# Patient Record
Sex: Female | Born: 1980 | Race: White | Hispanic: No | Marital: Married | Smoking: Never smoker
Health system: Southern US, Community
[De-identification: ages and names within clinical notes are randomized; demographics above are authoritative.]

## PROBLEM LIST (undated history)

## (undated) DIAGNOSIS — Z800 Family history of malignant neoplasm of digestive organs: Secondary | ICD-10-CM

## (undated) DIAGNOSIS — E559 Vitamin D deficiency, unspecified: Secondary | ICD-10-CM

## (undated) DIAGNOSIS — R011 Cardiac murmur, unspecified: Secondary | ICD-10-CM

## (undated) DIAGNOSIS — R519 Headache, unspecified: Secondary | ICD-10-CM

## (undated) DIAGNOSIS — K5790 Diverticulosis of intestine, part unspecified, without perforation or abscess without bleeding: Secondary | ICD-10-CM

## (undated) DIAGNOSIS — C4359 Malignant melanoma of other part of trunk: Secondary | ICD-10-CM

## (undated) DIAGNOSIS — R51 Headache: Secondary | ICD-10-CM

## (undated) DIAGNOSIS — E538 Deficiency of other specified B group vitamins: Secondary | ICD-10-CM

## (undated) DIAGNOSIS — K635 Polyp of colon: Secondary | ICD-10-CM

## (undated) DIAGNOSIS — K5710 Diverticulosis of small intestine without perforation or abscess without bleeding: Secondary | ICD-10-CM

## (undated) DIAGNOSIS — J45909 Unspecified asthma, uncomplicated: Secondary | ICD-10-CM

## (undated) DIAGNOSIS — F419 Anxiety disorder, unspecified: Secondary | ICD-10-CM

## (undated) DIAGNOSIS — K633 Ulcer of intestine: Secondary | ICD-10-CM

## (undated) DIAGNOSIS — D3A026 Benign carcinoid tumor of the rectum: Secondary | ICD-10-CM

## (undated) DIAGNOSIS — K219 Gastro-esophageal reflux disease without esophagitis: Secondary | ICD-10-CM

## (undated) DIAGNOSIS — K5090 Crohn's disease, unspecified, without complications: Secondary | ICD-10-CM

## (undated) DIAGNOSIS — T7840XA Allergy, unspecified, initial encounter: Secondary | ICD-10-CM

## (undated) DIAGNOSIS — K602 Anal fissure, unspecified: Secondary | ICD-10-CM

## (undated) DIAGNOSIS — B009 Herpesviral infection, unspecified: Secondary | ICD-10-CM

## (undated) HISTORY — DX: Vitamin D deficiency, unspecified: E55.9

## (undated) HISTORY — DX: Benign carcinoid tumor of the rectum: D3A.026

## (undated) HISTORY — DX: Allergy, unspecified, initial encounter: T78.40XA

## (undated) HISTORY — DX: Malignant melanoma of other part of trunk: C43.59

## (undated) HISTORY — PX: WISDOM TOOTH EXTRACTION: SHX21

## (undated) HISTORY — PX: MELANOMA EXCISION: SHX5266

## (undated) HISTORY — DX: Ulcer of intestine: K63.3

## (undated) HISTORY — DX: Deficiency of other specified B group vitamins: E53.8

## (undated) HISTORY — DX: Diverticulosis of intestine, part unspecified, without perforation or abscess without bleeding: K57.90

## (undated) HISTORY — DX: Polyp of colon: K63.5

## (undated) HISTORY — DX: Anxiety disorder, unspecified: F41.9

## (undated) HISTORY — DX: Diverticulosis of small intestine without perforation or abscess without bleeding: K57.10

## (undated) HISTORY — DX: Herpesviral infection, unspecified: B00.9

## (undated) HISTORY — DX: Cardiac murmur, unspecified: R01.1

## (undated) HISTORY — DX: Family history of malignant neoplasm of digestive organs: Z80.0

## (undated) HISTORY — PX: COLONOSCOPY: SHX174

---

## 2000-03-01 ENCOUNTER — Other Ambulatory Visit: Admission: RE | Admit: 2000-03-01 | Discharge: 2000-03-01 | Payer: Self-pay

## 2001-03-03 ENCOUNTER — Ambulatory Visit (HOSPITAL_COMMUNITY): Admission: RE | Admit: 2001-03-03 | Discharge: 2001-03-03 | Payer: Self-pay | Admitting: Surgery

## 2001-03-03 ENCOUNTER — Encounter: Admission: RE | Admit: 2001-03-03 | Discharge: 2001-03-03 | Payer: Self-pay | Admitting: Surgery

## 2001-03-03 ENCOUNTER — Encounter: Payer: Self-pay | Admitting: Surgery

## 2001-03-07 ENCOUNTER — Ambulatory Visit (HOSPITAL_BASED_OUTPATIENT_CLINIC_OR_DEPARTMENT_OTHER): Admission: RE | Admit: 2001-03-07 | Discharge: 2001-03-07 | Payer: Self-pay | Admitting: Surgery

## 2001-03-07 ENCOUNTER — Encounter (INDEPENDENT_AMBULATORY_CARE_PROVIDER_SITE_OTHER): Payer: Self-pay | Admitting: Specialist

## 2001-03-13 ENCOUNTER — Other Ambulatory Visit: Admission: RE | Admit: 2001-03-13 | Discharge: 2001-03-13 | Payer: Self-pay

## 2002-12-25 ENCOUNTER — Other Ambulatory Visit: Admission: RE | Admit: 2002-12-25 | Discharge: 2002-12-25 | Payer: Self-pay | Admitting: Obstetrics and Gynecology

## 2003-06-19 ENCOUNTER — Other Ambulatory Visit: Admission: RE | Admit: 2003-06-19 | Discharge: 2003-06-19 | Payer: Self-pay | Admitting: Obstetrics and Gynecology

## 2003-10-31 ENCOUNTER — Other Ambulatory Visit: Admission: RE | Admit: 2003-10-31 | Discharge: 2003-10-31 | Payer: Self-pay | Admitting: Obstetrics and Gynecology

## 2004-04-30 ENCOUNTER — Other Ambulatory Visit: Admission: RE | Admit: 2004-04-30 | Discharge: 2004-04-30 | Payer: Self-pay | Admitting: Obstetrics and Gynecology

## 2004-11-18 ENCOUNTER — Other Ambulatory Visit: Admission: RE | Admit: 2004-11-18 | Discharge: 2004-11-18 | Payer: Self-pay | Admitting: Obstetrics and Gynecology

## 2007-02-22 ENCOUNTER — Encounter: Admission: RE | Admit: 2007-02-22 | Discharge: 2007-02-22 | Payer: Self-pay | Admitting: Gastroenterology

## 2008-02-07 ENCOUNTER — Encounter: Admission: RE | Admit: 2008-02-07 | Discharge: 2008-02-07 | Payer: Self-pay | Admitting: Obstetrics and Gynecology

## 2008-06-26 ENCOUNTER — Inpatient Hospital Stay (HOSPITAL_COMMUNITY): Admission: AD | Admit: 2008-06-26 | Discharge: 2008-06-26 | Payer: Self-pay | Admitting: Obstetrics and Gynecology

## 2008-10-29 ENCOUNTER — Inpatient Hospital Stay (HOSPITAL_COMMUNITY): Admission: AD | Admit: 2008-10-29 | Discharge: 2008-11-01 | Payer: Self-pay | Admitting: Obstetrics and Gynecology

## 2008-11-02 ENCOUNTER — Encounter: Admission: RE | Admit: 2008-11-02 | Discharge: 2008-12-02 | Payer: Self-pay | Admitting: Obstetrics and Gynecology

## 2008-12-03 ENCOUNTER — Encounter: Admission: RE | Admit: 2008-12-03 | Discharge: 2009-01-02 | Payer: Self-pay | Admitting: Obstetrics and Gynecology

## 2009-01-03 ENCOUNTER — Encounter: Admission: RE | Admit: 2009-01-03 | Discharge: 2009-02-01 | Payer: Self-pay | Admitting: Obstetrics and Gynecology

## 2009-02-02 ENCOUNTER — Encounter: Admission: RE | Admit: 2009-02-02 | Discharge: 2009-03-04 | Payer: Self-pay | Admitting: Obstetrics and Gynecology

## 2009-03-05 ENCOUNTER — Encounter: Admission: RE | Admit: 2009-03-05 | Discharge: 2009-04-03 | Payer: Self-pay | Admitting: Obstetrics and Gynecology

## 2009-04-04 ENCOUNTER — Encounter: Admission: RE | Admit: 2009-04-04 | Discharge: 2009-04-24 | Payer: Self-pay | Admitting: Obstetrics and Gynecology

## 2009-05-05 ENCOUNTER — Encounter: Admission: RE | Admit: 2009-05-05 | Discharge: 2009-05-14 | Payer: Self-pay | Admitting: Obstetrics and Gynecology

## 2010-05-17 ENCOUNTER — Encounter: Payer: Self-pay | Admitting: Gastroenterology

## 2010-06-03 ENCOUNTER — Other Ambulatory Visit: Payer: Self-pay | Admitting: Dermatology

## 2010-08-02 LAB — CBC
HCT: 32.9 % — ABNORMAL LOW (ref 36.0–46.0)
Hemoglobin: 11.4 g/dL — ABNORMAL LOW (ref 12.0–15.0)
MCHC: 34.7 g/dL (ref 30.0–36.0)
MCHC: 34.7 g/dL (ref 30.0–36.0)
MCV: 81.1 fL (ref 78.0–100.0)
Platelets: 234 10*3/uL (ref 150–400)
RBC: 3.38 MIL/uL — ABNORMAL LOW (ref 3.87–5.11)
RDW: 14.2 % (ref 11.5–15.5)

## 2010-08-06 LAB — CBC
MCV: 84.6 fL (ref 78.0–100.0)
Platelets: 305 10*3/uL (ref 150–400)
WBC: 9.4 10*3/uL (ref 4.0–10.5)

## 2010-09-11 NOTE — Op Note (Signed)
[place]  Patient:    Kelli Lewis, Kelli Lewis Visit Number: [id number] MRN: [id number]          Service Type: DSU Location: [facility] Attending Physician:  Katha Cabal Dictated by:   Thornton Park Daphine Deutscher, M.D. Proc. Date: 03/07/01 Admit Date:  03/07/2001   CC:         [facility]  Ria Bush. Jorja Loa, M.D.   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  It was at least 1.03 mm and the margin was focally involved.  The thickness was therefore a minimum and not complete estimate of Breslow level.  The patient was seen with her parents in the office and informed consent was obtained regarding sentinel lymph node biopsy and wide excisional biopsy and primary closure.  Because of the depth of the lesion, it was felt that at least greater than 1 cm and almost 2 cm margin was necessary for complete excision and this was marked.  SURGEON:  Thornton Park. Daphine Deutscher, M.D.  ANESTHESIA:  General by LMA.  DESCRIPTION OF PROCEDURE:  The patient was taken to room 4 at [facility] and the area had been previously injected.  I mapped the inguinal region and found a hot area with counts of 1500 and marked that; this was just above the inguinal crease in the left lower quadrant.  I went ahead and injected the melanoma site with 1 cc of Lymphazurin blue and massaged that area a bit and then placed her back supine.  The groin area was prepped with Betadine and draped sterilely.  I made about a 2 cm incision over the hot area and went down through the superficial fascia to a blue, hot node.  This was carefully mobilized and the lymphatic channels going into it were ligated with 4-0 Vicryl and then the pedicles surrounding it were all ligated with 4-0 Vicryl suture ligatures.  It was removed, at which time,  its counts were in excess of 2500.  Background counts in the biopsy site were 25 and 25 was generally what was seen in the rest of the leg.  Therefore, this was the solitary blue, hot node.  The wound was closed with 4-0 Vicryl and with running 4-0 Prolene.  The patient was then lifted up with her leg, buttocks up, and the entire buttocks and the area that I had previously marked was prepped.  I tried to create an incision along the skin lines to the degree possible.  This was down on the lateral thigh on the left side.  I ended up excising this ellipse approximately 9 cm long and it leaves a little greater than 3 cm almost 4 cm wide that had about a 1.5 on approaching 2 cm margin.  This was carried down, straight down to the through the fat and was taken with sharp dissection off the fascia.  Bleeding was controlled with the electrocautery.  A short suture was placed on the anterior margin and a long suture was placed on the inferior margin of this ellipse.  When it was removed, it was sent for margins.  The wound was irrigated with saline.  I then approximated the skin using 4-0 Vicryl subcutaneously and subcuticularly.  A running 5-0 subcuticular suture was placed and then the final skin closure was performed with a running 4-0 Prolene with a  vertical mattress suture of 4-0 Prolene in the middle.  Sterile dressings were applied.  The patient was awakened.  She will be given Percocet 7.5/325 to take for pain and will be followed up in the office for suture removal in approximately ten days. Dictated by:   Thornton Park Daphine Deutscher, M.D. Attending Physician:  Katha Cabal DD:  03/07/01 TD:  03/07/01 Job: [id number]

## 2011-01-04 ENCOUNTER — Encounter: Payer: Self-pay | Admitting: Internal Medicine

## 2011-01-04 ENCOUNTER — Ambulatory Visit (INDEPENDENT_AMBULATORY_CARE_PROVIDER_SITE_OTHER): Payer: Managed Care, Other (non HMO) | Admitting: Internal Medicine

## 2011-01-04 VITALS — BP 112/80 | HR 78 | Ht 65.0 in | Wt 131.2 lb

## 2011-01-04 DIAGNOSIS — R109 Unspecified abdominal pain: Secondary | ICD-10-CM

## 2011-01-04 DIAGNOSIS — R1030 Lower abdominal pain, unspecified: Secondary | ICD-10-CM

## 2011-01-04 DIAGNOSIS — K602 Anal fissure, unspecified: Secondary | ICD-10-CM | POA: Insufficient documentation

## 2011-01-04 DIAGNOSIS — K625 Hemorrhage of anus and rectum: Secondary | ICD-10-CM

## 2011-01-04 HISTORY — DX: Anal fissure, unspecified: K60.2

## 2011-01-04 MED ORDER — HYOSCYAMINE SULFATE 0.125 MG PO TABS: 0.1250 mg | ORAL_TABLET | ORAL | Status: DC | PRN

## 2011-01-04 MED ORDER — HYOSCYAMINE SULFATE 0.125 MG SL SUBL: SUBLINGUAL_TABLET | SUBLINGUAL | Status: DC

## 2011-01-04 NOTE — Progress Notes (Signed)
Subjective:    Patient ID: Kelli Lewis, female    DOB: 04-01-1981, 30 y.o.   MRN: [id number]  HPI Kelli Lewis is a 30 year old female with a history of anal fissure who presents today, alone, for evaluation of rectal bleeding and lower abdominal pain.  The patient states she has an on-and-off history of bright red blood per, and that this has previously been evaluated both by Dr. Victorino Dike and Dr. Kinnie Scales.  She reports a normal flexible sigmoidoscopy in 2003, and subsequent upper endoscopy and colonoscopy in around 2007 (I do not have these reports today).  Over the last 1 month she has seen rectal bleeding daily. She reports having one to 2 bowel movements per day and with these bright red blood. She notes red blood in her stool and on the toilet tissue. Occasionally it will drip into the toilet water.  Occasionally she does have pain with passing stools but she denies constipation or hard stool. She also reports episodic loose stools associated with lower abdominal cramping. She reports this on occasion occurs at night, as the cramping wakes her from sleep and this is followed by 2-3 episodes of loose stool.  She reports a significant worsening of loose stool and abdominal cramping with stress.  She has not been able to associate this with eating or specific foods. She denies nausea and vomiting. She's had no fever or chills.  She does endorse abdominal bloating.  She denies heartburn, epigastric pain, dysphagia, and odynophagia.     Review of Systems Constitutional: Negative for fever, chills, night sweats, activity change, appetite change and unexpected weight change HEENT: Negative for sore throat, mouth sores and trouble swallowing. Eyes: Negative for visual disturbance Respiratory: Negative for cough, chest tightness and shortness of breath Cardiovascular: Negative for chest pain, palpitations and lower extremity swelling Gastrointestinal: See history of present illness Genitourinary: Negative  for dysuria and hematuria. Musculoskeletal: Negative for back pain, arthralgias and myalgias Skin: Negative for rash or color change Neurological: Negative for headaches, weakness, numbness Hematological: Negative for adenopathy, negative for easy bruising/bleeding Psychiatric/behavioral: Negative for depressed mood, negative for anxiety, increased stress recently over the separation from her husband  PMH: Anal fissures  PSH: neg  Meds: Mirena singulair  All: NKDA  Family History  Problem Relation Age of Onset  . Colon cancer Paternal Grandmother   . Breast cancer Paternal Grandmother   . Parkinsonism Father   . Irritable bowel syndrome Mother   . 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  Recent separation from her husband and this has been associated with significant stress.  Denies tob, ETOH, illicits.     Objective:   Physical Exam BP 112/80  Pulse 78  Ht 5\' 5"  (1.651 m)  Wt 131 lb 3.2 oz (59.512 kg)  BMI 21.83 kg/m2 Constitutional: Well-developed and well-nourished. No distress. HEENT: Normocephalic and atraumatic. Oropharynx is clear and moist. No oropharyngeal exudate. Conjunctivae are normal. Pupils are equal round and reactive to light. No scleral icterus. Neck: Neck supple. Trachea midline. Cardiovascular: Normal rate, regular rhythm and intact distal pulses. No M/R/G Pulmonary/chest: Effort normal and breath sounds normal. No wheezing, rales or rhonchi. Abdominal: Soft, nontender, nondistended. Bowel sounds active throughout. There are no masses palpable. No hepatosplenomegaly. Rectal: normal external exam with hemorrhoids or fissures, internal exam without pain or masses Lymphadenopathy: No cervical adenopathy noted. Neurological: Alert and oriented to person place and time. Skin: Skin is warm and dry. No rashes  noted. Psychiatric: Normal mood and affect. Behavior is normal.     Assessment & Plan:  This is a 30 year old female with a past  medical history of anal fissure who presents with recent increased rectal bleeding and intermittent/episodic lower abdominal pain with cramping and loose stools.  1. Rectal bleeding - her rectal examination was unrevealing today, however the bleeding is most consistent with hemorrhoidal bleeding. I cannot however rule out rectal inflammation and dust will schedule her for an unsedated flexible sigmoidoscopy. This will be performed to rule out proctitis.  I also have requested the old records including procedure records from Dr. Kinnie Scales.  2. Abd pain/cramping - the patient's lower abdominal cramping and episodic loose stools associated with stress is most consistent with IBS.  We discussed this today. It does seem that her symptoms are somewhat better now that her stress levels have decreased after her recent separation.  There are no alarm symptoms, other than the bleeding for which we will investigate with flexible sigmoidoscopy.  I have given her samples of Align, 1 tablet daily. We will use this an attempt to regulate her bowel pattern. I've also given her prescription for Levsin 0.125 mg one to 2 tablets every 4 hours when necessary cramping/spasm.  We can evaluate her response to these medications at her flex will sigmoidoscopy.  Followup will be determined after flexible sigmoidoscopy.

## 2011-01-04 NOTE — Patient Instructions (Signed)
Please start on Align 1 capsule daily You have been scheduled for a flexible sigmoidoscopy, see additional instructions

## 2011-01-14 ENCOUNTER — Telehealth: Payer: Self-pay | Admitting: Internal Medicine

## 2011-01-14 NOTE — Telephone Encounter (Signed)
Given abdominal pain and bloating, full colonoscopy is reasonable and can be scheduled. She will need to bring a driver as this exam will be sedated. She also needs to get a full colonoscopy preparation I recommend MiraLAX 17 g twice daily for one day then 17 g daily thereafter for constipationI I am not sure based on scheduled if the exam can be moved up I would still like to obtain the records from Dr. Kinnie Scales of her prior procedures

## 2011-01-14 NOTE — Telephone Encounter (Signed)
Pt's OV 01/04/11 for rectal bleeding and intermittent lower abdominal pain and cramping with loose stools. Dr Rhea Belton will do a flex sig to r/o bleeding hemorrhoids, possible IBS. She was given Librarian, academic and Levsin. Today, pt reports she's constipated, no BM in 4 days. Her cramping still comes and goes and she feels bloated. She has rectal pain/pressure while sitting also. Pt has FLEX SIG scheduled for 01/26/11 at 2:30; pt wonders if she should have a COLON instead and can it be done earlier? Suggestions for constipation? Thanks.

## 2011-01-14 NOTE — Telephone Encounter (Signed)
lmom for pt to call. There is an opening on 01/20/11 at 0900.

## 2011-01-14 NOTE — Telephone Encounter (Signed)
Informed pt Dr Rhea Belton will do a COLON on her. She is aware this is a 2 day prep procedure and will come for her PV on 01/18/11 and her COLON is 01/26/11 at 2:30 pm.

## 2011-01-15 ENCOUNTER — Other Ambulatory Visit: Payer: Self-pay | Admitting: Internal Medicine

## 2011-01-15 ENCOUNTER — Encounter: Payer: Self-pay | Admitting: Internal Medicine

## 2011-01-15 DIAGNOSIS — K625 Hemorrhage of anus and rectum: Secondary | ICD-10-CM

## 2011-01-15 NOTE — Progress Notes (Signed)
I scheduled pt for the wrong PV day, so I did the Pre Visit instructions and forms. Pt stated she did not tolerate Movi Prep, so she was given SUPREP; pt stated understanding with the instructions.

## 2011-01-18 ENCOUNTER — Other Ambulatory Visit: Payer: Managed Care, Other (non HMO)

## 2011-01-26 ENCOUNTER — Encounter: Payer: Self-pay | Admitting: Internal Medicine

## 2011-01-26 ENCOUNTER — Ambulatory Visit (AMBULATORY_SURGERY_CENTER): Payer: Managed Care, Other (non HMO) | Admitting: Internal Medicine

## 2011-01-26 VITALS — BP 120/78 | HR 68 | Temp 97.0°F | Resp 18 | Ht 65.0 in | Wt 131.0 lb

## 2011-01-26 DIAGNOSIS — K5289 Other specified noninfective gastroenteritis and colitis: Secondary | ICD-10-CM

## 2011-01-26 DIAGNOSIS — K625 Hemorrhage of anus and rectum: Secondary | ICD-10-CM

## 2011-01-26 DIAGNOSIS — D126 Benign neoplasm of colon, unspecified: Secondary | ICD-10-CM

## 2011-01-26 DIAGNOSIS — K5120 Ulcerative (chronic) proctitis without complications: Secondary | ICD-10-CM

## 2011-01-26 MED ORDER — SODIUM CHLORIDE 0.9 % IV SOLN: 500.0000 mL | INTRAVENOUS | Status: DC

## 2011-01-26 MED ORDER — MESALAMINE 1000 MG RE SUPP: 1000.0000 mg | Freq: Every day | RECTAL | Status: DC

## 2011-01-26 NOTE — Patient Instructions (Signed)
Please read the handouts given to you in the envelope by your recovery room nurse.   Your biopsy results will be mailed to you within 2 weeks.  Dr. Rhea Belton would like for you to start canasa suppositories 1000mg  at bedtime to reduce inflammation.  Try to leave it in for at least three hours.  Please, make an appointment to see me in the office in 4 weeks.   I would call tomorrow for this appointment,   If you have any questions, please call [phone].   Thank-you for choosing [place] today for your healthcare needs.

## 2011-01-27 ENCOUNTER — Telehealth: Payer: Self-pay | Admitting: *Deleted

## 2011-01-27 NOTE — Telephone Encounter (Signed)
Follow up Call- Patient questions:  Do you have a fever, pain , or abdominal swelling? no Pain Score  0 *  Have you tolerated food without any problems? yes  Have you been able to return to your normal activities? yes  Do you have any questions about your discharge instructions: Diet   no Medications  no Follow up visit  no  Do you have questions or concerns about your Care? yes Pt had multiple questions about report. Educated pt on findings and recommendations. Pt verbalized understanding.  Actions: * If pain score is 4 or above: No action needed, pain <4.

## 2011-01-27 NOTE — Telephone Encounter (Signed)
Notified pt she has a f/u with Dr Rhea Belton on 02/23/11 at 0900am; pt stated understanding.

## 2011-02-05 ENCOUNTER — Encounter: Payer: Self-pay | Admitting: Internal Medicine

## 2011-02-08 ENCOUNTER — Telehealth: Payer: Self-pay | Admitting: *Deleted

## 2011-02-08 NOTE — Telephone Encounter (Signed)
Informed pt of Dr Lauro Franklin findings. Pt reports she is much better- no blood in her stool. She is using the suppositories, Canasa prn; she will f/u on 02/23/11 at 0900 and call for further problems.

## 2011-02-08 NOTE — Telephone Encounter (Signed)
Letter from: Beverley Fiedler   Comments: 02/08/11 SENT Lucienne Capers  Please call patient to check on her symptoms. I started medication after colonoscopy. The bx results are consistent with IBD (maybe Crohn's), which is a new dx for her.  I need to see her in clinic in about 1 month.      lmom for pt to call back.

## 2011-02-23 ENCOUNTER — Encounter: Payer: Self-pay | Admitting: Internal Medicine

## 2011-02-26 ENCOUNTER — Ambulatory Visit (INDEPENDENT_AMBULATORY_CARE_PROVIDER_SITE_OTHER): Payer: Managed Care, Other (non HMO) | Admitting: Internal Medicine

## 2011-02-26 ENCOUNTER — Encounter: Payer: Self-pay | Admitting: Internal Medicine

## 2011-02-26 VITALS — BP 102/70 | HR 82 | Ht 65.0 in | Wt 137.8 lb

## 2011-02-26 DIAGNOSIS — K529 Noninfective gastroenteritis and colitis, unspecified: Secondary | ICD-10-CM | POA: Insufficient documentation

## 2011-02-26 DIAGNOSIS — K5289 Other specified noninfective gastroenteritis and colitis: Secondary | ICD-10-CM

## 2011-02-26 MED ORDER — MESALAMINE 1.2 G PO TBEC: 2400.0000 mg | DELAYED_RELEASE_TABLET | Freq: Every day | ORAL | Status: DC

## 2011-02-26 MED ORDER — SIMETHICONE 125 MG PO TABS: 125.0000 mg | ORAL_TABLET | ORAL | Status: DC | PRN

## 2011-02-26 NOTE — Progress Notes (Signed)
Subjective:    Patient ID: Kelli Lewis, female    DOB: 1980-07-08, 30 y.o.   MRN: [id number]  HPI Kelli Lewis is a 30 year old female with a recent diagnosis of indeterminate inflammatory bowel disease who returns for followup.  The patient underwent colonoscopy in October 2012 at that time was found to have proctitis as well as periappendiceal inflammation which on pathology revealed both mild acute and chronic inflammation consistent with inflammatory bowel disease. No granulomas were seen. No dysplasia was seen. The terminal ileum appeared normal at this procedure.  After the procedure she was started on Canasa suppository. She reports that she has been using this as needed, but not daily because it gives her the feeling of constipation. She is using it maybe once a week at present. She is using Citrucel one to 2 chewable tablets daily for bloating. Abdominal bloating continues to be a major symptom for her. She also continues to have intermittent loose stools and is frequently seeing rectal bleeding. She reports having anywhere from one to 5 bowel movements a day. She will occasionally have nocturnal stools. She denies fevers or chills. She is having no nausea or vomiting. Her appetite has been good. Her weight is stable. Overall she will occasionally have lower abdominal cramping but bloating bothers her more than pain. She does report tenesmus and some rectal pain.  No joint pains, rashes, or eye symptoms.   Review of Systems Constitutional: Negative for fever, chills, night sweats, activity change, appetite change and unexpected weight change HEENT: Negative for sore throat, mouth sores and trouble swallowing. Eyes: Negative for visual disturbance Respiratory: Negative for cough, chest tightness and shortness of breath Cardiovascular: Negative for chest pain, palpitations and lower extremity swelling Gastrointestinal: See history of present illness Genitourinary: Negative for dysuria and  hematuria. Musculoskeletal: Negative for back pain, arthralgias and myalgias Skin: Negative for rash or color change Neurological: Negative for headaches, weakness, numbness Hematological: Negative for adenopathy, negative for easy bruising/bleeding Psychiatric/behavioral: Negative for depressed mood, negative for anxiety  Patient Active Problem List  Diagnoses  . Anal fissure  . IBD (inflammatory bowel disease)   Current Outpatient Prescriptions  Medication Sig Dispense Refill  . doxycycline (VIBRAMYCIN) 100 MG capsule 1 tablet Daily.      . hyoscyamine (LEVSIN SL) 0.125 MG SL tablet Place 1-2 tablets under tongue q 4 hours prn abdominal pain  60 tablet  2  . levonorgestrel (MIRENA) 20 MCG/24HR IUD 1 each by Intrauterine route once.        . mesalamine (CANASA) 1000 MG suppository Place 1 suppository (1,000 mg total) rectally at bedtime.  30 suppository  2  . montelukast (SINGULAIR) 10 MG tablet Take 10 mg by mouth at bedtime.        . mesalamine (LIALDA) 1.2 G EC tablet Take 2 tablets (2.4 g total) by mouth daily with breakfast.  60 tablet  5  . Simethicone 125 MG TABS Take 1 tablet (125 mg total) by mouth as needed.  90 tablet     No Known Allergies  Family History  Problem Relation Age of Onset  . Colon cancer Paternal Grandmother   . Breast cancer Paternal Grandmother   . Parkinsonism Father   . Colon polyps Father   . Irritable bowel syndrome Mother   . Esophageal cancer Neg Hx   . Stomach cancer Neg Hx   --she reviewed her family history and states that her grandfather had Crohn's disease .   Social History  . Marital Status:  Single    Number of Children: 1   Occupational History  . CPA    Social History Main Topics  . Smoking status: Never Smoker   . Smokeless tobacco: Never Used  . Alcohol Use: No  . Drug Use: No      Objective:   Physical Exam BP 102/70  Pulse 82  Ht 5\' 5"  (1.651 m)  Wt 137 lb 12.8 oz (62.506 kg)  BMI 22.93 kg/m2  SpO2  98% Constitutional: Well-developed and well-nourished. No distress. HEENT: Normocephalic and atraumatic. Oropharynx is clear and moist. No oropharyngeal exudate. Conjunctivae are normal. Pupils are equal round and reactive to light. No scleral icterus. Neck: Neck supple. Trachea midline. Cardiovascular: Normal rate, regular rhythm and intact distal pulses. No M/R/G Pulmonary/chest: Effort normal and breath sounds normal. No wheezing, rales or rhonchi. Abdominal: Soft, very mild tenderness to palpation across the lower abdomen with no rebound or guarding, nondistended. Bowel sounds active throughout. There are no masses palpable. No hepatosplenomegaly. Extremities: no clubbing, cyanosis, or edema Lymphadenopathy: No cervical adenopathy noted. Neurological: Alert and oriented to person place and time. Skin: Skin is warm and dry. No rashes noted. Psychiatric: Normal mood and affect. Behavior is normal.      Assessment & Plan:  30 year old female with a recent diagnosis of indeterminate inflammatory bowel disease who returns for followup  1. IBD/indeterminate colitis -- we have discussed the diagnosis of inflammatory bowel disease today at length. We also discussed the chronicity of this condition. It is difficult for me to tell whether this is also colitis or Crohn's disease at present.  She is not using any 5-ASA therapy regularly at present, and she continues to have proctitis symptoms including tenesmus, rectal pain, rectal bleeding, and loose stools.  A lot of her symptoms are bloating, and she feels constipated with use of the Canasa suppositories. There may be an overlap of irritable bowel disease as well. She will prefer to try an oral therapy to control her inflammation, and this is reasonable. We will start Lialda 2.4 g daily.  We had discussed this medication and for now she will discontinue the use of Canasa. We did discuss that some patients need medication delivered PR for adequate  treatment of proctitis, but that a trial of oral therapy is reasonable. It is possible that her bloating will improve with adequate control of her colitis. The plan will be to continue Lialda 2.4 g daily for 8 weeks.  I will see her back in 6 weeks time to gauge her response.  I recommended that she continue the use Levsin as needed for cramping pain. Also suggested she try simethicone as needed for bloating. We will give her any anti-bloating diet today as well. Other therapies can be tried later, if the bloating persists after management of her inflammation.

## 2011-02-26 NOTE — Patient Instructions (Addendum)
You may start Simethicone daily for gas, this may be purchased over the counter. Stop your Canasa for now. Start Lialda 2.4g per day this has been sent to your pharmacy. Return in 6 weeks

## 2011-04-07 ENCOUNTER — Encounter: Payer: Self-pay | Admitting: Internal Medicine

## 2011-04-12 ENCOUNTER — Ambulatory Visit (INDEPENDENT_AMBULATORY_CARE_PROVIDER_SITE_OTHER): Payer: Managed Care, Other (non HMO) | Admitting: Internal Medicine

## 2011-04-12 ENCOUNTER — Encounter: Payer: Self-pay | Admitting: Internal Medicine

## 2011-04-12 VITALS — BP 110/60 | HR 72 | Ht 64.0 in | Wt 135.8 lb

## 2011-04-12 DIAGNOSIS — K529 Noninfective gastroenteritis and colitis, unspecified: Secondary | ICD-10-CM

## 2011-04-12 DIAGNOSIS — K5289 Other specified noninfective gastroenteritis and colitis: Secondary | ICD-10-CM

## 2011-04-12 MED ORDER — MESALAMINE 1.2 G PO TBEC: 2400.0000 mg | DELAYED_RELEASE_TABLET | Freq: Every day | ORAL | Status: DC

## 2011-04-12 NOTE — Progress Notes (Signed)
Subjective:    Patient ID: Kelli Lewis, female    DOB: [birth year], 30 y.o.   MRN: [id number]  HPI [name] is a 30 year old female with a recent diagnosis of indeterminate inflammatory bowel disease who returns for followup. At her last visit she was having ongoing loose stools with frequent rectal bleeding. She is also reporting abdominal bloating. At her last visit her Canasa suppository was stopped and she was started on Lialda 2.4 g daily.  With this she reports significant improvement of her symptoms. She reports her stools now are more formed and the significantly less bleeding. She does occasionally see scant red blood on the toilet tissue. No melena. Improved tenesmus. No anterior abdominal pain at this point. She also reports significant improvement in her abdominal bloating. No nausea or vomiting. No joint pains or rashes. She has noted dry eyes for which she has seen her eye doctor and reportedly no inflammation was found. Some changes were made to her contact lenses. No fevers chills or night sweats  Review of Systems Constitutional: Negative for fever, chills, night sweats, activity change, appetite change and unexpected weight change HEENT: Negative for sore throat, mouth sores and trouble swallowing. Eyes: Negative for visual disturbance Respiratory: Negative for cough, chest tightness and shortness of breath Cardiovascular: Negative for chest pain, palpitations and lower extremity swelling Gastrointestinal: See history of present illness Genitourinary: Negative for dysuria and hematuria. Musculoskeletal: Negative for back pain, arthralgias and myalgias Skin: Negative for rash or color change Neurological: Negative for headaches, weakness, numbness Hematological: Negative for adenopathy, negative for easy bruising/bleeding Psychiatric/behavioral: Negative for depressed mood, negative for anxiety  Patient Active Problem List  Diagnoses  . Anal fissure  . IBD (inflammatory bowel  disease)   Past Surgical History  Procedure Date  . Melanoma excision    Current Outpatient Prescriptions  Medication Sig Dispense Refill  . doxycycline (VIBRAMYCIN) 100 MG capsule 1 tablet Daily.      . hyoscyamine (LEVSIN SL) 0.125 MG SL tablet Place 1-2 tablets under tongue q 4 hours prn abdominal pain  60 tablet  2  . levonorgestrel (MIRENA) 20 MCG/24HR IUD 1 each by Intrauterine route once.        . mesalamine (LIALDA) 1.2 G EC tablet Take 2 tablets (2.4 g total) by mouth daily with breakfast.  60 tablet  5  . montelukast (SINGULAIR) 10 MG tablet Take 10 mg by mouth at bedtime.        . Simethicone 125 MG TABS Take 1 tablet (125 mg total) by mouth as needed.  90 tablet     No Known Allergies  SH - reviewed and no change  Family History  Problem Relation Age of Onset  . Colon cancer Paternal Grandmother   . Breast cancer Paternal Grandmother   . Crohn's disease Paternal Grandmother   . Parkinsonism Father   . Colon polyps Father   . Irritable bowel syndrome Mother   . Esophageal cancer Neg Hx   . Stomach cancer Neg Hx       Objective:   Physical Exam BP 110/60  Pulse 72  Ht 5\' 4"  (1.626 m)  Wt 135 lb 12.8 oz (61.598 kg)  BMI 23.31 kg/m2 Constitutional: Well-developed and well-nourished. No distress. HEENT: Normocephalic and atraumatic. Oropharynx is clear and moist. No oropharyngeal exudate. Conjunctivae are normal. Pupils are equal round and reactive to light. No scleral icterus. Cardiovascular: Normal rate, regular rhythm and intact distal pulses. No M/R/G Pulmonary/chest: Effort normal and breath sounds normal.  No wheezing, rales or rhonchi. Abdominal: Soft, nontender, nondistended. Bowel sounds active throughout. There are no masses palpable. No hepatosplenomegaly. Extremities: no clubbing, cyanosis, or edema Neurological: Alert and oriented to person place and time. Skin: Skin is warm and dry. No rashes noted. Psychiatric: Normal mood and affect. Behavior is  normal.    Assessment & Plan:  30 year old female with a recent diagnosis of indeterminate inflammatory bowel disease who returns for followup  1. IBD -- overall her symptoms are significantly improved on Lialda 2.4 g daily.  This also seemed to help with her abdominal gas and bloating. At this point I do not think any major changes needed in her IBD therapy as overall she is well. I will refill her Lialda today and she can followup in 6 month's time. We again discussed the chronicity of this disease and how there is potential for it to flare. I've asked that she call me should her symptoms worsen or change prior to her next visit with me. She voiced understanding.

## 2011-04-12 NOTE — Patient Instructions (Signed)
Follow up in 6 months with Dr. Rhea Belton.

## 2011-10-21 ENCOUNTER — Other Ambulatory Visit: Payer: Self-pay | Admitting: Obstetrics and Gynecology

## 2011-10-21 DIAGNOSIS — N6321 Unspecified lump in the left breast, upper outer quadrant: Secondary | ICD-10-CM

## 2011-10-27 ENCOUNTER — Ambulatory Visit
Admission: RE | Admit: 2011-10-27 | Discharge: 2011-10-27 | Disposition: A | Discharge status: Home / Self Care | Payer: BC Managed Care – PPO | Source: Ambulatory Visit | Attending: Obstetrics and Gynecology | Admitting: Obstetrics and Gynecology

## 2011-10-27 DIAGNOSIS — N6321 Unspecified lump in the left breast, upper outer quadrant: Secondary | ICD-10-CM

## 2012-05-25 ENCOUNTER — Other Ambulatory Visit: Payer: Self-pay | Admitting: Dermatology

## 2013-01-19 ENCOUNTER — Encounter (HOSPITAL_COMMUNITY): Payer: Self-pay | Admitting: *Deleted

## 2013-01-19 ENCOUNTER — Ambulatory Visit: Payer: BC Managed Care – PPO | Admitting: Physician Assistant

## 2013-01-19 ENCOUNTER — Emergency Department (HOSPITAL_COMMUNITY)
Admission: EM | Admit: 2013-01-19 | Discharge: 2013-01-19 | Disposition: A | Discharge status: Home / Self Care | Payer: BC Managed Care – PPO | Attending: Emergency Medicine | Admitting: Emergency Medicine

## 2013-01-19 ENCOUNTER — Emergency Department (HOSPITAL_COMMUNITY): Payer: BC Managed Care – PPO

## 2013-01-19 ENCOUNTER — Telehealth: Payer: Self-pay | Admitting: Internal Medicine

## 2013-01-19 DIAGNOSIS — Z85820 Personal history of malignant melanoma of skin: Secondary | ICD-10-CM | POA: Insufficient documentation

## 2013-01-19 DIAGNOSIS — Z860100 Personal history of colon polyps, unspecified: Secondary | ICD-10-CM | POA: Insufficient documentation

## 2013-01-19 DIAGNOSIS — Z3202 Encounter for pregnancy test, result negative: Secondary | ICD-10-CM | POA: Insufficient documentation

## 2013-01-19 DIAGNOSIS — Z79899 Other long term (current) drug therapy: Secondary | ICD-10-CM | POA: Insufficient documentation

## 2013-01-19 DIAGNOSIS — K5010 Crohn's disease of large intestine without complications: Secondary | ICD-10-CM

## 2013-01-19 DIAGNOSIS — N390 Urinary tract infection, site not specified: Secondary | ICD-10-CM | POA: Insufficient documentation

## 2013-01-19 DIAGNOSIS — Z8619 Personal history of other infectious and parasitic diseases: Secondary | ICD-10-CM | POA: Insufficient documentation

## 2013-01-19 DIAGNOSIS — Z8719 Personal history of other diseases of the digestive system: Secondary | ICD-10-CM | POA: Insufficient documentation

## 2013-01-19 DIAGNOSIS — K5090 Crohn's disease, unspecified, without complications: Secondary | ICD-10-CM | POA: Insufficient documentation

## 2013-01-19 HISTORY — DX: Crohn's disease, unspecified, without complications: K50.90

## 2013-01-19 LAB — COMPREHENSIVE METABOLIC PANEL
ALT: 16 U/L (ref 0–35)
Alkaline Phosphatase: 52 U/L (ref 39–117)
CO2: 25 mEq/L (ref 19–32)
Calcium: 9.1 mg/dL (ref 8.4–10.5)
Chloride: 102 mEq/L (ref 96–112)
Creatinine, Ser: 0.67 mg/dL (ref 0.50–1.10)
GFR calc Af Amer: >90 mL/min (ref >90)
GFR calc non Af Amer: >90 mL/min (ref >90)
Glucose, Bld: 99 mg/dL (ref 70–99)
Sodium: 139 mEq/L (ref 135–145)
Total Protein: 7.3 g/dL (ref 6.0–8.3)

## 2013-01-19 LAB — URINALYSIS W MICROSCOPIC + REFLEX CULTURE
Hgb urine dipstick: NEGATIVE
Nitrite: NEGATIVE
Protein, ur: NEGATIVE mg/dL
Specific Gravity, Urine: 1.012 (ref 1.005–1.030)
Urobilinogen, UA: 0.2 mg/dL (ref 0.0–1.0)

## 2013-01-19 LAB — CBC WITH DIFFERENTIAL/PLATELET
Basophils Absolute: 0 10*3/uL (ref 0.0–0.1)
Eosinophils Absolute: 0.1 10*3/uL (ref 0.0–0.7)
Eosinophils Relative: 1 % (ref 0–5)
HCT: 37.5 % (ref 36.0–46.0)
Hemoglobin: 12.8 g/dL (ref 12.0–15.0)
Lymphocytes Relative: 20 % (ref 12–46)
Lymphs Abs: 1.7 10*3/uL (ref 0.7–4.0)
MCH: 27.7 pg (ref 26.0–34.0)
MCV: 81.2 fL (ref 78.0–100.0)
Monocytes Absolute: 0.5 10*3/uL (ref 0.1–1.0)
Monocytes Relative: 6 % (ref 3–12)
Neutrophils Relative %: 73 % (ref 43–77)
Platelets: 297 10*3/uL (ref 150–400)
RDW: 13.1 % (ref 11.5–15.5)
WBC: 8.4 10*3/uL (ref 4.0–10.5)

## 2013-01-19 LAB — LIPASE, BLOOD: Lipase: 37 U/L (ref 11–59)

## 2013-01-19 MED ORDER — PREDNISONE 10 MG PO TABS: 40.0000 mg | ORAL_TABLET | Freq: Every day | ORAL | Status: DC

## 2013-01-19 MED ORDER — OXYCODONE-ACETAMINOPHEN 5-325 MG PO TABS: 1.0000 | ORAL_TABLET | ORAL | Status: DC | PRN

## 2013-01-19 MED ORDER — ONDANSETRON HCL 4 MG/2ML IJ SOLN
4.0000 mg | Freq: Once | INTRAMUSCULAR | Status: AC
  Administered 2013-01-19: 4 mg via INTRAVENOUS
  Filled 2013-01-19: qty 2

## 2013-01-19 MED ORDER — METRONIDAZOLE 500 MG PO TABS: 500.0000 mg | ORAL_TABLET | Freq: Two times a day (BID) | ORAL | Status: DC

## 2013-01-19 MED ORDER — IOHEXOL 300 MG/ML  SOLN
100.0000 mL | Freq: Once | INTRAMUSCULAR | Status: AC | PRN
  Administered 2013-01-19: 100 mL via INTRAVENOUS

## 2013-01-19 MED ORDER — IOHEXOL 300 MG/ML  SOLN
25.0000 mL | Freq: Once | INTRAMUSCULAR | Status: AC | PRN
  Administered 2013-01-19: 25 mL via ORAL

## 2013-01-19 MED ORDER — METHYLPREDNISOLONE SODIUM SUCC 125 MG IJ SOLR
125.0000 mg | Freq: Once | INTRAMUSCULAR | Status: AC
  Administered 2013-01-19: 125 mg via INTRAVENOUS
  Filled 2013-01-19: qty 2

## 2013-01-19 MED ORDER — MESALAMINE 1.2 G PO TBEC: 2400.0000 mg | DELAYED_RELEASE_TABLET | Freq: Every day | ORAL | Status: DC

## 2013-01-19 MED ORDER — CEPHALEXIN 500 MG PO CAPS: 500.0000 mg | ORAL_CAPSULE | Freq: Four times a day (QID) | ORAL | Status: DC

## 2013-01-19 MED ORDER — SODIUM CHLORIDE 0.9 % IV SOLN
Freq: Once | INTRAVENOUS | Status: AC
  Administered 2013-01-19: 12:00:00 via INTRAVENOUS

## 2013-01-19 MED ORDER — HYDROMORPHONE HCL PF 1 MG/ML IJ SOLN
1.0000 mg | Freq: Once | INTRAMUSCULAR | Status: AC
  Administered 2013-01-19: 0.5 mg via INTRAVENOUS
  Filled 2013-01-19: qty 1

## 2013-01-19 NOTE — Telephone Encounter (Signed)
On call note @ 0025. Pt having lower abd and rectal pain not relived with Levsin. advised ED evaluation now or office evaluation later this morning.

## 2013-01-19 NOTE — ED Provider Notes (Signed)
CSN: [id number]     Arrival date & time 01/19/13  4098 History   First MD Initiated Contact with Patient 01/19/13 1038     Chief Complaint  Patient presents with  . Abdominal Pain   (Consider location/radiation/quality/duration/timing/severity/associated sxs/prior Treatment) HPI Kelli Lewis is a 32 y.o. female who presents emergency department with abdominal pain. Patient states this is tired having abdominal pain yesterday. States pain is sharp and severe. States it's all over. States history of Crohn's disease but reports it has been under control. She called gastroenterologist on call who told her to take Levsin which she did. Patient states no relief with Levsin. Patient denies any nausea, vomiting. States she has had loose stools. She denies urinary symptoms. Denies any blood in her stool. Patient denies any fevers. No other complaints.   Past Medical History  Diagnosis Date  . Duodenal diverticulum   . Melanoma of buttock   . Herpes   . Family history of malignant neoplasm of gastrointestinal tract   . Crohn's disease    Past Surgical History  Procedure Laterality Date  . Melanoma excision     Family History  Problem Relation Age of Onset  . Colon cancer Paternal Grandmother   . Breast cancer Paternal Grandmother   . Crohn's disease Paternal Grandmother   . Parkinsonism Father   . Colon polyps Father   . Irritable bowel syndrome Mother   . Esophageal cancer Neg Hx   . Stomach cancer Neg Hx    History  Substance Use Topics  . Smoking status: Never Smoker   . Smokeless tobacco: Never Used  . Alcohol Use: Yes   OB History   Grav Para Term Preterm Abortions TAB SAB Ect Mult Living                 Review of Systems  Constitutional: Negative for fever and chills.  HENT: Negative for neck pain and neck stiffness.   Respiratory: Negative for cough, chest tightness and shortness of breath.   Cardiovascular: Negative for chest pain, palpitations and leg swelling.   Gastrointestinal: Positive for abdominal pain. Negative for nausea, vomiting, diarrhea, constipation and blood in stool.  Genitourinary: Negative for dysuria, flank pain, vaginal bleeding, vaginal discharge, vaginal pain and pelvic pain.  Musculoskeletal: Negative for myalgias and arthralgias.  Skin: Negative for rash.  Neurological: Negative for dizziness, weakness and headaches.  All other systems reviewed and are negative.    Allergies  Review of patient's allergies indicates no known allergies.  Home Medications   Current Outpatient Rx  Name  Route  Sig  Dispense  Refill  . hyoscyamine (LEVSIN SL) 0.125 MG SL tablet      Place 1-2 tablets under tongue q 4 hours prn abdominal pain   60 tablet   2   . mesalamine (LIALDA) 1.2 G EC tablet   Oral   Take 2,400 mg by mouth daily as needed (abdominal pain/discomfort).          . montelukast (SINGULAIR) 10 MG tablet   Oral   Take 10 mg by mouth daily as needed (asthma).          Marland Kitchen levonorgestrel (MIRENA) 20 MCG/24HR IUD   Intrauterine   1 each by Intrauterine route once.            BP 110/70  Pulse 63  Temp(Src) 98.6 F (37 C) (Oral)  Resp 26  SpO2 100% Physical Exam  Nursing note and vitals reviewed. Constitutional: She appears well-developed and  well-nourished. No distress.  Neck: Neck supple.  Cardiovascular: Normal rate, regular rhythm and normal heart sounds.   Pulmonary/Chest: Effort normal and breath sounds normal. No respiratory distress. She has no wheezes. She has no rales.  Abdominal: Soft. Bowel sounds are normal. She exhibits no distension. There is tenderness. There is guarding. There is no rebound.  Diffuse abdominal tenderness  Neurological: She is alert.  Skin: Skin is warm and dry.    ED Course  Procedures (including critical care time) Labs Review Labs Reviewed  URINALYSIS W MICROSCOPIC + REFLEX CULTURE - Abnormal; Notable for the following:    APPearance CLOUDY (*)    Leukocytes, UA  TRACE (*)    Bacteria, UA MANY (*)    Squamous Epithelial / LPF FEW (*)    All other components within normal limits  URINE CULTURE  CBC WITH DIFFERENTIAL  COMPREHENSIVE METABOLIC PANEL  LIPASE, BLOOD  POCT PREGNANCY, URINE   Imaging Review Ct Abdomen Pelvis W Contrast  01/19/2013   CLINICAL DATA:  Generalized abdominal pain since yesterday. Current history of Crohn's disease.  EXAM: CT ABDOMEN AND PELVIS WITH CONTRAST  TECHNIQUE: Multidetector CT imaging of the abdomen and pelvis was performed using the standard protocol following bolus administration of intravenous contrast.  CONTRAST:  OMNIPAQUE IOHEXOL 300 MG/ML IV. Oral contrast was also administered.  COMPARISON:  None.  FINDINGS: Wall thickening involving the entire descending colon and sigmoid colon, with adjacent edema/ inflammation in the surrounding fat. Small amount of ascites dependently in the pelvis and in the paracolic gutters. Scattered diverticula involving the sigmoid colon. Cecum, ascending colon, and transverse colon uninvolved. Distal ileum decompressed ; no definite small bowel involvement. No evidence of bowel obstruction. Normal appendix in the right upper pelvis. No free intraperitoneal air. Stomach normal in appearance containing food.  Sub 5 mm low-attenuation lesion consistent with a cyst in the right lobe of liver near the dome. Indeterminate approximate 0.7 x 0.8 x 1.3 cm low-attenuation lesion in the anterior segment right lobe of liver (series 2, image 22 and coronal image 57). No other focal hepatic parenchymal abnormalities. Normal-appearing spleen with 2 foci of accessory splenic tissue inferior to the tip of the spleen. Normal appearing pancreas, adrenal glands, kidneys, and gallbladder. No biliary ductal dilation. No visible aorto-iliofemoral atherosclerosis. Accessory renal arteries noted bilaterally. Visceral arteries patent. No significant lymphadenopathy.  Uterus normal in size and appearance containing an  intrauterine device. Approximate 3 cm simple cyst arising from the right ovary. No other adnexal masses. Urinary bladder unremarkable.  Bone window images unremarkable apart from a broad-based central disc protrusion at L5-S1. Visualized lung bases clear apart from the expected dependent atelectasis posteriorly. Heart size normal.  IMPRESSION: 1. Inflammation involving the entire descending colon and sigmoid colon. This may be infectious or a manifestation of an acute Crohn's flare. No convincing small bowel involvement. 2. Small amount of ascites dependently in the pelvis and in the paracolic gutters. 3. No evidence of bowel obstruction or free intraperitoneal air. 4. Indeterminate 1.3 cm right lobe liver lesion, statistically a small hemangioma.   Electronically Signed   By: Hulan Saas   On: 01/19/2013 14:06    MDM   1. Crohn's colitis   2. UTI (lower urinary tract infection)     PT in ER with severe diffuse abdominal tenderness. She is guarding. Any time when palpated over abdomen, she throws her head back and screams and starts crying. Labs ordered and are normal. CT abd/pelvis ordered.   3:03  PM Pt's lab work is normal. CT showed ascending and sigmoid colitis. I discussed pt with GI. At this time both agree pt is stable for d/c home given her pain is controlled, she has no vomiting, VS normal. Recommended to start her on Lialda daily 1.2g, 2 tab QAM, Prednisone 40mg  daily, Pain medications, Metronidazole 500mg  BID. Pt has an apt to follow up with [facility] on Monday at 11AM. PT also has a UTI, will treat with keflex.   Plan discussed with pt. Agreeable.   Filed Vitals:   01/19/13 1004 01/19/13 1145 01/19/13 1230 01/19/13 1406  BP: 150/83 126/81 110/70 107/71  Pulse: 104 96 63 71  Temp: 98.6 F (37 C)     TempSrc: Oral     Resp: 26   16  SpO2: 97% 100% 100% 100%       Lottie Mussel, PA-C 01/19/13 1536

## 2013-01-19 NOTE — Telephone Encounter (Signed)
Pt last seen 04/12/11; hx of Crohn's. She reports she thinks she may be having a flare. She is having soft stools, but nor diarrhea or blood. She is however having terrible cramps and spasms in her rectum that are unbearable and that pain radiates up under her rib cage. I offered to order Canasa, but she asked if that would help the rib pain and I states I didn't know. She did try Levsin q4hr with no help. Pt will see Mike Gip, PA today.

## 2013-01-19 NOTE — ED Notes (Signed)
Pt. Reported to have abdominal pain that is diffuse all over.  Pt. Reported pain started last night and is taking her breath away.  Pt. Noted to have a history of crohn's disease and reported this feels like her crohn's symptoms.

## 2013-01-20 LAB — URINE CULTURE

## 2013-01-21 NOTE — ED Provider Notes (Signed)
Medical screening examination/treatment/procedure(s) were performed by non-physician practitioner and as supervising physician I was immediately available for consultation/collaboration.   Gatha Mcnulty, MD 01/21/13 1503 

## 2013-01-22 ENCOUNTER — Ambulatory Visit (INDEPENDENT_AMBULATORY_CARE_PROVIDER_SITE_OTHER): Payer: BC Managed Care – PPO | Admitting: Physician Assistant

## 2013-01-22 ENCOUNTER — Encounter: Payer: Self-pay | Admitting: Physician Assistant

## 2013-01-22 VITALS — BP 102/70 | HR 68 | Ht 64.0 in | Wt 143.2 lb

## 2013-01-22 DIAGNOSIS — K529 Noninfective gastroenteritis and colitis, unspecified: Secondary | ICD-10-CM

## 2013-01-22 DIAGNOSIS — K5289 Other specified noninfective gastroenteritis and colitis: Secondary | ICD-10-CM

## 2013-01-22 DIAGNOSIS — C439 Malignant melanoma of skin, unspecified: Secondary | ICD-10-CM

## 2013-01-22 DIAGNOSIS — R197 Diarrhea, unspecified: Secondary | ICD-10-CM

## 2013-01-22 MED ORDER — BUDESONIDE 9 MG PO TB24: 1.0000 | ORAL_TABLET | Freq: Every day | ORAL | Status: DC

## 2013-01-22 MED ORDER — MESALAMINE 1.2 G PO TBEC: DELAYED_RELEASE_TABLET | ORAL | Status: DC

## 2013-01-22 NOTE — Progress Notes (Addendum)
Subjective:    Patient ID: Kelli Lewis, female    DOB: [birth year], 32 y.o.   MRN: [id number]  HPI Kelli Lewis is a pleasant 32 year old female known to Dr. Rhea Belton  todayrtle with history of inflammatory bowel disease. She was last seen in our office in 2012. She had undergone colonoscopy in 2012 with finding of. Appendiceal implement inflammation and also evidence for proctitis. Biopsies from the pericecal area showed acute and chronic colitis as did the biopsies from the rectum and the features were felt to favor Crohn's oh for ulcerative colitis.she had been treated with steroids in the past as well as Lialda. The patient tells me she was not taking only all the regularly but would take it as needed for abdominal discomfort. Last week she developed onset of abdominal pain cramping and diarrhea and eventually went to the emergency room for evaluation do to pain. She had CT scan of the abdomen and pelvis done on 01/19/2013 with contrast that showed wall thickening extending from the descending colon to the sigmoid with edema in the surrounding fat as well as was felt consistent with Crohn's versus infectious etiology. Labs with CBC and seem at were unremarkable. She did have a positive UA and had complaints of dysuria but urine culture shows no growth .She was started back on lialda 2.4 g daily, given prednisone 40 mg by mouth daily and a prescription for Keflex as well as Flagyl. His been taking all of these over the weekend and says she does feel a bit better still very fatigued and has abdominal discomfort and some cramping but no severe pain. She still having about 5 bowel movements per day which are nonbloody.   Review of Systems  Constitutional: Positive for fatigue.  HENT: Negative.   Eyes: Negative.   Respiratory: Negative.   Cardiovascular: Negative.   Gastrointestinal: Positive for nausea, abdominal pain, diarrhea and rectal pain.  Endocrine: Negative.   Genitourinary: Positive for dysuria.   Musculoskeletal: Negative.   Skin: Negative.   Allergic/Immunologic: Negative.   Neurological: Negative.   Hematological: Negative.   Psychiatric/Behavioral: Negative.    Outpatient Prescriptions Prior to Visit  Medication Sig Dispense Refill  . cephALEXin (KEFLEX) 500 MG capsule Take 1 capsule (500 mg total) by mouth 4 (four) times daily.  20 capsule  0  . hyoscyamine (LEVSIN SL) 0.125 MG SL tablet Place 1-2 tablets under tongue q 4 hours prn abdominal pain  60 tablet  2  . levonorgestrel (MIRENA) 20 MCG/24HR IUD 1 each by Intrauterine route once.        . metroNIDAZOLE (FLAGYL) 500 MG tablet Take 1 tablet (500 mg total) by mouth 2 (two) times daily.  14 tablet  0  . montelukast (SINGULAIR) 10 MG tablet Take 10 mg by mouth daily as needed (asthma).       . oxyCODONE-acetaminophen (PERCOCET) 5-325 MG per tablet Take 1 tablet by mouth every 4 (four) hours as needed for pain.  20 tablet  0  . predniSONE (DELTASONE) 10 MG tablet Take 4 tablets (40 mg total) by mouth daily.  28 tablet  0  . mesalamine (LIALDA) 1.2 G EC tablet Take 2,400 mg by mouth daily as needed (abdominal pain/discomfort).       . mesalamine (LIALDA) 1.2 G EC tablet Take 2 tablets (2.4 g total) by mouth daily with breakfast.  30 tablet  0   No facility-administered medications prior to visit.   No Known Allergies Patient Active Problem List   Diagnosis Date Noted  .  Melanoma of skin, site unspecified 01/22/2013  . IBD (inflammatory bowel disease) 02/26/2011  . Anal fissure 01/04/2011   History  Substance Use Topics  . Smoking status: Never Smoker   . Smokeless tobacco: Never Used  . Alcohol Use: Yes   family history includes Breast cancer in her paternal grandmother; Colon cancer in her paternal grandmother; Colon polyps in her father; Crohn's disease in her paternal grandmother; Irritable bowel syndrome in her mother; Parkinsonism in her father. There is no history of Esophageal cancer or Stomach cancer.      Objective:   Physical Exam  Well-developed young white female in no acute distress, pleasant blood pressure 102/70 pulse 68 height 5 foot 4 weight 143. HEENT; nontraumatic normocephalic EOMI PERRLA sclera anicteric, Neck;and and and andsupple no JVD, Cardiovascular regular rate and rhythm with S1-S2 no murmur or gallop, Pulmonary ;clear bilaterally, Abdomen; soft she is mildly tender bilateral lower quadrants no guarding or rebound no palpable mass or hepatosplenomegaly, Rectal; exam not done, EXt; no clubbing cyanosis or edema skin warm and dry, Psych; mood and affect normal and appropriate        Assessment & Plan:  #69  32 year old female with history of indeterminant IBD favoring Crohn's by biopsy who now presents with an acute exacerbation with evidence of a left-sided colitis on CT done 01/19/2013 #2 probable UTI-improving on Keflex  Plan; increase Lialda 2.4 g daily, 2 tablets twice daily Stop prednisone and start Ulceris 9  milligrams by mouth daily she was given a prescription and samples today I offered an anti-spasmodic but she does not feel that they have been helpful in the past She will complete her current course of Keflex and will stop the metronidazole Followup with Dr. Rhea Belton in one month, she's encouraged call in the interim for problems. I emphasized the need for adherence to a maintenance regimen, and again discussed compliance with meds even when feeling well. She is encouraged not to stop the Ulceris or Lialda prior to her return office visit.  Addendum: Reviewed and agree with initial management. Beverley Fiedler, MD

## 2013-01-22 NOTE — Patient Instructions (Addendum)
Stop the Metronidazole. Stop the Prednisone.  We sent a prescription for Uceris 9 mg and samples. Take 1 daily for next 2 months. Savings card given to take to the pharmacy.  We also gave you samples of Lialda and sent refills with a savings card. [facility] [address] Nicholes Rough.   We made you a follow up appointment with Dr. Rhea Belton on 02-20-2013 at 10 Am.

## 2013-02-16 ENCOUNTER — Encounter: Payer: Self-pay | Admitting: Internal Medicine

## 2013-02-20 ENCOUNTER — Ambulatory Visit (INDEPENDENT_AMBULATORY_CARE_PROVIDER_SITE_OTHER): Payer: BC Managed Care – PPO | Admitting: Internal Medicine

## 2013-02-20 ENCOUNTER — Encounter: Payer: Self-pay | Admitting: Internal Medicine

## 2013-02-20 VITALS — BP 122/74 | HR 72 | Ht 64.0 in | Wt 141.0 lb

## 2013-02-20 DIAGNOSIS — K5289 Other specified noninfective gastroenteritis and colitis: Secondary | ICD-10-CM

## 2013-02-20 DIAGNOSIS — Z23 Encounter for immunization: Secondary | ICD-10-CM

## 2013-02-20 DIAGNOSIS — K529 Noninfective gastroenteritis and colitis, unspecified: Secondary | ICD-10-CM

## 2013-02-20 MED ORDER — PNEUMOCOCCAL VAC POLYVALENT 25 MCG/0.5ML IJ INJ: 0.5000 mL | INJECTION | Freq: Once | INTRAMUSCULAR | Status: AC

## 2013-02-20 NOTE — Progress Notes (Signed)
Subjective:    Patient ID: Kelli Lewis, female    DOB: [birth year], 32 y.o.   MRN: [id number]  HPI Kelli Lewis is a 33 year old female with a past medical history of IBD, likely UC, who is seen in office followup. She was seen approximately one month ago by Mike Gip, PA-C for evaluation of acute flare of her IBD. She was initially seen by me in September 2012 and colonoscopy was performed a month later which showed acute and chronic colitis in the rectum and a patch at the appendix/cecal base.  She was treated with steroids initially and then Lialda. At her last followup with me in December 2012 she had improved and the plan is for her to continue maintenance therapy with Lialda. She was then lost to followup. She subsequently stopped her 5 ASA and felt like her disease may have resolved entirely. Then in September 2014 she developed abdominal pain along with diarrhea which led her to the emergency department. There CT scan showed thickening of her entire left colon consistent with colitis. She was given a prescription for prednisone and followed up in our office with Amy. That appointment her prednisone was stopped in favor of colonic release budesonide and resuming Lialda 4.8 g daily.  Over the last month she reports she felt like she slid backwards when changing to budesonide and so she made the decision to resume the prednisone given to her by the ER. This was 40 mg a day without a taper. She took this for approximately 10-15 days and improved. She then resumed her colonic release budesonide which she is on still. Overall her abdominal pain has improved significantly. Her bowel habits have not normalized entirely. She still having 4-6 soft stools daily but they are nonbloody. She reports improvement in her abdominal bloating. She reports very sporadic and fleeting upper abdominal pain but much much better than before. She denies fevers or chills. He was also treated for UA and her dysuria has resolved. No  recent rashes, joint pains or eye complaints  Review of Systems As per history of present illness, otherwise negative  Current Medications, Allergies, Past Medical History, Past Surgical History, Family History and Social History were reviewed in [employer] record.     Objective:   Physical Exam BP 122/74  Pulse 72  Ht 5\' 4"  (1.626 m)  Wt 141 lb (63.957 kg)  BMI 24.19 kg/m2 Constitutional: Well-developed and well-nourished. No distress. HEENT: Normocephalic and atraumatic. Oropharynx is clear and moist. No oropharyngeal exudate. Conjunctivae are normal.  No scleral icterus. Neck: Neck supple. Trachea midline. Cardiovascular: Normal rate, regular rhythm and intact distal pulses.  Pulmonary/chest: Effort normal and breath sounds normal. No wheezing, rales or rhonchi. Abdominal: Soft, nontender, nondistended. Bowel sounds active throughout. There are no masses palpable. No hepatosplenomegaly. Extremities: no clubbing, cyanosis, or edema Neurological: Alert and oriented to person place and time. Skin: Skin is warm and dry. No rashes noted. Psychiatric: Normal mood and affect. Behavior is normal.  CBC    Component Value Date/Time   WBC 8.4 01/19/2013 1106   RBC 4.62 01/19/2013 1106   HGB 12.8 01/19/2013 1106   HCT 37.5 01/19/2013 1106   PLT 297 01/19/2013 1106   MCV 81.2 01/19/2013 1106   MCH 27.7 01/19/2013 1106   MCHC 34.1 01/19/2013 1106   RDW 13.1 01/19/2013 1106   LYMPHSABS 1.7 01/19/2013 1106   MONOABS 0.5 01/19/2013 1106   EOSABS 0.1 01/19/2013 1106   BASOSABS 0.0 01/19/2013 1106  CMP     Component Value Date/Time   NA 139 01/19/2013 1106   K 3.5 01/19/2013 1106   CL 102 01/19/2013 1106   CO2 25 01/19/2013 1106   GLUCOSE 99 01/19/2013 1106   BUN 11 01/19/2013 1106   CREATININE 0.67 01/19/2013 1106   CALCIUM 9.1 01/19/2013 1106   PROT 7.3 01/19/2013 1106   ALBUMIN 4.1 01/19/2013 1106   AST 18 01/19/2013 1106   ALT 16 01/19/2013 1106   ALKPHOS 52 01/19/2013  1106   BILITOT 0.9 01/19/2013 1106   GFRNONAA >90 01/19/2013 1106   GFRAA >90 01/19/2013 1106        Assessment & Plan:  32 year old female with a past medical history of IBD, likely UC, who is seen in office followup  1.  IBD, indeterminate colitis -- while her IBD is indeterminate, I favor ulcerative colitis over Crohn's, this is primarily based on the previous excellent response to mesalamine only. It seems that she is improving and her acute flare is resolving. We have discussed the natural history of IBD and how it is unlikely to self resolve and will likely require maintenance medication indefinitely. At this time hopefully after steroid treatment she can maintain remission with mesalamine alone. I would like for her to complete the entire previously prescribed course of colonic release budesonide. She self decreased her mesalamine to  2.4 g daily. For now we will continue Lialda 2.4 g daily.  If her abdominal pain or diarrhea worsens I've asked that she let me know immediately. If this were to occur, or should she require steroids or more frequent basis we would need to escalate therapy. We briefly discussed immunomodulator and biologic therapy today. At this point I do not think her disease warrants a more aggressive maintenance therapy. I've asked that she avoid NSAIDs, she does occasionally use Naprosyn for headaches. I would like to see her back in about 8 weeks for reassessment of her symptoms. She was given a handout today explaining the use of immunomodulators and biologics, as well as combination therapy so that she can familiarize herself with these medications should they become necessary. --I recommended Pneumovax and it was given today --She has already had her flu shot

## 2013-02-20 NOTE — Patient Instructions (Signed)
You have been given a Pnemovax today  Complete uceris course and continue taking Lialda 2.4g daily.  Follow up with Dr. Rhea Belton in office in late December.                                               We are excited to introduce MyChart, a new best-in-class service that provides you online access to important information in your electronic medical record. We want to make it easier for you to view your health information - all in one secure location - when and where you need it. We expect MyChart will enhance the quality of care and service we provide.  When you register for MyChart, you can:    View your test results.    Request appointments and receive appointment reminders via email.    Request medication renewals.    View your medical history, allergies, medications and immunizations.    Communicate with your physician's office through a password-protected site.    Conveniently print information such as your medication lists.  To find out if MyChart is right for you, please talk to a member of our clinical staff today. We will gladly answer your questions about this free health and wellness tool.  If you are age 37 or older and want a member of your family to have access to your record, you must provide written consent by completing a proxy form available at our office. Please speak to our clinical staff about guidelines regarding accounts for patients younger than age 35.  As you activate your MyChart account and need any technical assistance, please call the MyChart technical support line at [phone]-[phone] [phone]) or email your question to [email or web address]. If you email your question(s), please include your name, a return phone number and the best time to reach you.  If you have non-urgent health-related questions, you can send a message to our office through MyChart at [email or web address]. If you have a medical emergency, call 911.  Thank you for using MyChart  as your new health and wellness resource!   MyChart licensed from [employer],  4540-9811. Patents Pending.

## 2013-03-12 ENCOUNTER — Telehealth: Payer: Self-pay | Admitting: Internal Medicine

## 2013-03-13 NOTE — Telephone Encounter (Signed)
Pt last seen 02/20/13 after being lost to the system. Suspected UC. Last COLON 01/26/11 did not mention hemorrhoids. lmom for pt to call back with more information.

## 2013-03-14 NOTE — Telephone Encounter (Signed)
I would increase the Lialda to 4.8 g daily Analpram 3 times a day for hemorrhoids If this does not improve quickly she needs to be seen

## 2013-03-14 NOTE — Telephone Encounter (Signed)
Pt reports she has a hemorrhoid and her bottom is raw from numerous BMs. She reports she is taking all her meds as instructed and doesn't know if she should double up on the Lialda. Offered her an appt, but she states she can't afford an OV right now. Instructed her to take sitz baths in warm water when she can to shrink the hemorrhoid. Encouraged her to use baby wipes or tucks w/o perfume to wipe with. Please advise. Thanks.

## 2013-03-15 ENCOUNTER — Telehealth: Payer: Self-pay | Admitting: Internal Medicine

## 2013-03-15 MED ORDER — PRAMOXINE-HC 1-1 % EX CREA: TOPICAL_CREAM | Freq: Three times a day (TID) | CUTANEOUS | Status: DC

## 2013-03-15 NOTE — Telephone Encounter (Signed)
lmom for pt to call back

## 2013-03-16 NOTE — Telephone Encounter (Signed)
Patient notified of the recommendations .  She is aware to increase Lialda to 2 BID and call back if she does not have improvement the beginning of next week.

## 2013-04-10 ENCOUNTER — Telehealth: Payer: Self-pay | Admitting: Internal Medicine

## 2013-04-10 MED ORDER — PREDNISONE 10 MG PO TABS: ORAL_TABLET | ORAL | Status: DC

## 2013-04-10 NOTE — Telephone Encounter (Signed)
Pt with hx of Crohn's last seen 02/20/13. She thinks her Crohn's is flaring up; reports the same symptoms as 2 months ago when she went to the hospital. She reports constant cramping x 4 days, generally not feeling good. She has been doubling up on the Lialda, but has no steroids. Please advise. Thanks.

## 2013-04-10 NOTE — Telephone Encounter (Signed)
Several flares over last couple months Please increase mesalamine back to 4.8 g daily Will retreat with prednisone taper starting at 40 mg daily x 7 days,  decreasing by 5 mg every 7 days until off Office followup in about 4 weeks to discuss escalation of therapy

## 2013-04-10 NOTE — Telephone Encounter (Signed)
Informed pt of Dr Lauro Franklin orders. Ordered prednisone and mailed her an appt.

## 2013-04-12 ENCOUNTER — Telehealth: Payer: Self-pay | Admitting: Internal Medicine

## 2013-04-12 MED ORDER — CIPROFLOXACIN HCL 500 MG PO TABS: ORAL_TABLET | ORAL | Status: DC

## 2013-04-12 NOTE — Telephone Encounter (Signed)
Pt is prone to UTIs and states she knows where she has one. She feels she is getting one soon and wonders if you will give her something? Thanks

## 2013-04-12 NOTE — Telephone Encounter (Signed)
Informed pt of AB and to call if no improvement in 1-2 days; pt stated understanding.

## 2013-04-12 NOTE — Telephone Encounter (Signed)
cipro 500 mg twice daily x 3 days, call if not improving after 1-2 days

## 2013-04-23 ENCOUNTER — Ambulatory Visit: Payer: BC Managed Care – PPO | Admitting: Internal Medicine

## 2013-05-10 ENCOUNTER — Encounter: Payer: Self-pay | Admitting: Internal Medicine

## 2013-05-11 ENCOUNTER — Ambulatory Visit (INDEPENDENT_AMBULATORY_CARE_PROVIDER_SITE_OTHER): Payer: 59 | Admitting: Internal Medicine

## 2013-05-11 ENCOUNTER — Encounter: Payer: Self-pay | Admitting: Internal Medicine

## 2013-05-11 ENCOUNTER — Other Ambulatory Visit (INDEPENDENT_AMBULATORY_CARE_PROVIDER_SITE_OTHER): Payer: 59

## 2013-05-11 ENCOUNTER — Other Ambulatory Visit: Payer: Self-pay | Admitting: Physician Assistant

## 2013-05-11 VITALS — BP 122/80 | HR 60 | Ht 64.0 in | Wt 145.0 lb

## 2013-05-11 DIAGNOSIS — K529 Noninfective gastroenteritis and colitis, unspecified: Secondary | ICD-10-CM

## 2013-05-11 DIAGNOSIS — K5289 Other specified noninfective gastroenteritis and colitis: Secondary | ICD-10-CM

## 2013-05-11 LAB — COMPREHENSIVE METABOLIC PANEL
ALT: 20 U/L (ref 0–35)
AST: 19 U/L (ref 0–37)
Albumin: 3.9 g/dL (ref 3.5–5.2)
Alkaline Phosphatase: 37 U/L — ABNORMAL LOW (ref 39–117)
BUN: 11 mg/dL (ref 6–23)
CO2: 31 meq/L (ref 19–32)
CREATININE: 0.7 mg/dL (ref 0.4–1.2)
Calcium: 9.5 mg/dL (ref 8.4–10.5)
Chloride: 102 mEq/L (ref 96–112)
GFR: 97.26 mL/min (ref >60.00)
Glucose, Bld: 92 mg/dL (ref 70–99)
Potassium: 3.8 mEq/L (ref 3.5–5.1)
Sodium: 139 mEq/L (ref 135–145)
Total Bilirubin: 1.4 mg/dL — ABNORMAL HIGH (ref 0.3–1.2)
Total Protein: 6.5 g/dL (ref 6.0–8.3)

## 2013-05-11 LAB — CBC
HCT: 42.8 % (ref 36.0–46.0)
HEMOGLOBIN: 14.4 g/dL (ref 12.0–15.0)
MCHC: 33.5 g/dL (ref 30.0–36.0)
MCV: 85 fl (ref 78.0–100.0)
Platelets: 359 10*3/uL (ref 150.0–400.0)
RBC: 5.04 Mil/uL (ref 3.87–5.11)
RDW: 13.9 % (ref 11.5–14.6)
WBC: 12.7 10*3/uL — ABNORMAL HIGH (ref 4.5–10.5)

## 2013-05-11 LAB — HIGH SENSITIVITY CRP: CRP HIGH SENSITIVITY: 1.35 mg/L (ref 0.000–5.000)

## 2013-05-11 LAB — SEDIMENTATION RATE: Sed Rate: 7 mm/hr (ref 0–22)

## 2013-05-11 MED ORDER — MESALAMINE 1.2 G PO TBEC: DELAYED_RELEASE_TABLET | ORAL | Status: DC

## 2013-05-11 NOTE — Progress Notes (Signed)
Subjective:    Patient ID: Kelli Lewis, female    DOB: [birth year], 33 y.o.   MRN: [id number]  HPI Kelli Lewis is a 33 year old female with a past medical history of indeterminate colitis who is seen in office followup. She was last seen in October 2014 as she was improving from acute colitis flare. After her visit with me on 02/20/2013 she developed recurrent flare and prednisone taper was started in early December 2014. She did 40 mg tapering by 5 mg every week. She just finished her prednisone yesterday. Also for about a month she increased her Lialda to 4.8 g daily. She reports she is finally feeling better. She has no further abdominal pain or cramping. Appetite has returned to normal. No nausea or vomiting. No fevers or chills. Her stool frequency is still above normal for her but her stools are soft and nonbloody. No diarrhea. Normal for her is 1-2 stools daily. With the steroid she had some trouble sleeping and irritability but no anxiety or depressed mood. She did notice some mild acne on her face as well as bloating/fluid retention. She has decreased her Lialda back to 2.4 g daily no missed work. No joint pains. No mouth ulcers. No rashes. No further dysuria or frequency and she was treated for presumed UTI in December as well with Cipro   Review of Systems As per history of present illness, otherwise negative  Current Medications, Allergies, Past Medical History, Past Surgical History, Family History and Social History were reviewed in [employer] record.     Objective:   Physical Exam BP 122/80  Pulse 60  Ht 5' 4"  (1.626 m)  Wt 145 lb (65.772 kg)  BMI 24.88 kg/m2 Constitutional: Well-developed and well-nourished. No distress. HEENT: Normocephalic and atraumatic. Oropharynx is clear and moist. No oropharyngeal exudate. Conjunctivae are normal.  No scleral icterus. Neck: Neck supple. Trachea midline. Cardiovascular: Normal rate, regular rhythm and intact distal  pulses. No M/R/G Pulmonary/chest: Effort normal and breath sounds normal. No wheezing, rales or rhonchi. Abdominal: Soft, mildly tender without rebound or guarding, nondistended. Bowel sounds active throughout. There are no masses palpable. No hepatosplenomegaly. Extremities: no clubbing, cyanosis, or edema Lymphadenopathy: No cervical adenopathy noted. Neurological: Alert and oriented to person place and time. Skin: Skin is warm and dry. No rashes noted. Psychiatric: Normal mood and affect. Behavior is normal.      Assessment & Plan:  33 year old female with a past medical history of indeterminate colitis who is seen in office followup.  1.  IBD/indeterminate colitis -- she has had flare, with some waxing and waning since September 2014. She is required to steroid tapers. It seems that she is nearing remission but her stool frequency has still not returned to normal. I recommended increasing Lialda back to 4.8 g daily and continuing at this dose for now. I have again discussed escalation of therapy for her with either immunomodulators or Biologics. We discussed maintenance therapy including immunomodulators and biologics.  We also discussed the risks in great detail including the risk of infection (including reactivation of latent TB and underlying viral hepatitis), hepatotoxicity, leukopenia, pancreatitis, nausea, malignancy (specifically lymphoma), demyelinating disease, and even heart failure.   I would like to prevent the need for steroids to achieve remission. I recommend colonoscopy to evaluate colonic mucosa and assess the degree of inflammation. 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  For now she will continue Lialda 4.8 g daily  We will plan colonoscopy in late April 2015, but if acute flare symptoms recur before this time, or if she needs steroids again, we will perform colonoscopy  earlier.  She understands and agrees with this recommendation  Labs today to include CBC, CMP, ESR, CRP and TPMT  She was given a handout comparing the response rates as well as the side effects and expectations for IBD patients treated with immunomodulators, biologics or both  Return as needed otherwise colonoscopy in April

## 2013-05-11 NOTE — Patient Instructions (Signed)
Your physician has requested that you go to the basement for the following lab work before leaving today: CBC, CMP, ESR, TPMT, CRP  We have sent the following medications to your pharmacy for you to pick up at your convenience: Lialda  Dr. Hilarie Fredrickson recommends you have a colonoscopy in Early May. His schedule is not out for April-May yet. Please call our office [phone] in February to schedule                                               We are excited to introduce MyChart, a new best-in-class service that provides you online access to important information in your electronic medical record. We want to make it easier for you to view your health information - all in one secure location - when and where you need it. We expect MyChart will enhance the quality of care and service we provide.  When you register for MyChart, you can:    View your test results.    Request appointments and receive appointment reminders via email.    Request medication renewals.    View your medical history, allergies, medications and immunizations.    Communicate with your physician's office through a password-protected site.    Conveniently print information such as your medication lists.  To find out if MyChart is right for you, please talk to a member of our clinical staff today. We will gladly answer your questions about this free health and wellness tool.  If you are age 34 or older and want a member of your family to have access to your record, you must provide written consent by completing a proxy form available at our office. Please speak to our clinical staff about guidelines regarding accounts for patients younger than age 58.  As you activate your MyChart account and need any technical assistance, please call the MyChart technical support line at [phone]) or email your question to [email or web address]. If you email your question(s), please include your name, a return phone  number and the best time to reach you.  If you have non-urgent health-related questions, you can send a message to our office through [facility] [email or web address]. If you have a medical emergency, call 911.  Thank you for using MyChart as your new health and wellness resource!   MyChart licensed from [employer],  1999-2010. Patents Pending.

## 2013-05-19 LAB — THIOPURINE METHYLTRANSFERASE (TPMT), RBC: Thiopurine Methyltransferase, RBC: 14 (ref >12)

## 2013-06-25 NOTE — Telephone Encounter (Signed)
See Previous Encounter

## 2013-07-31 ENCOUNTER — Other Ambulatory Visit: Payer: Self-pay | Admitting: Gastroenterology

## 2013-08-25 ENCOUNTER — Other Ambulatory Visit: Payer: Self-pay | Admitting: Internal Medicine

## 2013-12-05 ENCOUNTER — Telehealth: Payer: Self-pay | Admitting: Internal Medicine

## 2013-12-05 NOTE — Telephone Encounter (Signed)
Patient not able to set up a colon at this time.  I did schedule her for a office visit for 02/07/14 to discuss

## 2013-12-05 NOTE — Telephone Encounter (Signed)
Patient reports that she has been off her meds for approximately one month.  She has been taking her Lialda 4 a day for a few days and she found some old Uceris and started herself back on it.    She is having gas, diarrhea and abdominal bloating.  She is not having any cramping, urgency or bleeding.  Please advise

## 2013-12-05 NOTE — Telephone Encounter (Signed)
Continue Lialda 4.8 g daily, okay for Uceris 9 mg daily  we had planned for colonoscopy in late April after tax season, this did not occur I recommend colonoscopy for her to evaluate IBD activity/severity/extent

## 2013-12-05 NOTE — Telephone Encounter (Signed)
Left message for patient to call back  

## 2013-12-26 ENCOUNTER — Other Ambulatory Visit: Payer: Self-pay | Admitting: *Deleted

## 2013-12-26 MED ORDER — MESALAMINE 1.2 G PO TBEC: DELAYED_RELEASE_TABLET | ORAL | Status: DC

## 2014-02-04 ENCOUNTER — Other Ambulatory Visit: Payer: Self-pay | Admitting: *Deleted

## 2014-02-04 MED ORDER — MESALAMINE 1.2 G PO TBEC: DELAYED_RELEASE_TABLET | ORAL | Status: DC

## 2014-02-07 ENCOUNTER — Telehealth: Payer: Self-pay | Admitting: Internal Medicine

## 2014-02-07 ENCOUNTER — Encounter: Payer: Self-pay | Admitting: Internal Medicine

## 2014-02-07 ENCOUNTER — Other Ambulatory Visit (INDEPENDENT_AMBULATORY_CARE_PROVIDER_SITE_OTHER): Payer: 59

## 2014-02-07 ENCOUNTER — Ambulatory Visit (INDEPENDENT_AMBULATORY_CARE_PROVIDER_SITE_OTHER): Payer: 59 | Admitting: Internal Medicine

## 2014-02-07 VITALS — BP 110/86 | HR 80 | Ht 64.0 in | Wt 140.2 lb

## 2014-02-07 DIAGNOSIS — K51919 Ulcerative colitis, unspecified with unspecified complications: Secondary | ICD-10-CM

## 2014-02-07 DIAGNOSIS — R140 Abdominal distension (gaseous): Secondary | ICD-10-CM

## 2014-02-07 LAB — COMPREHENSIVE METABOLIC PANEL
ALK PHOS: 52 U/L (ref 39–117)
ALT: 17 U/L (ref 0–35)
AST: 21 U/L (ref 0–37)
Albumin: 3.9 g/dL (ref 3.5–5.2)
BILIRUBIN TOTAL: 1.1 mg/dL (ref 0.2–1.2)
BUN: 13 mg/dL (ref 6–23)
CO2: 28 meq/L (ref 19–32)
CREATININE: 0.7 mg/dL (ref 0.4–1.2)
Calcium: 9.7 mg/dL (ref 8.4–10.5)
Chloride: 100 mEq/L (ref 96–112)
GFR: 99.99 mL/min (ref >60.00)
Glucose, Bld: 95 mg/dL (ref 70–99)
Potassium: 3.7 mEq/L (ref 3.5–5.1)
Sodium: 137 mEq/L (ref 135–145)
Total Protein: 7.9 g/dL (ref 6.0–8.3)

## 2014-02-07 LAB — CBC WITH DIFFERENTIAL/PLATELET
BASOS ABS: 0.1 10*3/uL (ref 0.0–0.1)
Basophils Relative: 0.8 % (ref 0.0–3.0)
EOS ABS: 0.1 10*3/uL (ref 0.0–0.7)
Eosinophils Relative: 1.3 % (ref 0.0–5.0)
HEMATOCRIT: 43.2 % (ref 36.0–46.0)
Hemoglobin: 14.5 g/dL (ref 12.0–15.0)
Lymphocytes Relative: 19.2 % (ref 12.0–46.0)
Lymphs Abs: 1.6 10*3/uL (ref 0.7–4.0)
MCHC: 33.6 g/dL (ref 30.0–36.0)
MCV: 83.4 fl (ref 78.0–100.0)
MONO ABS: 0.4 10*3/uL (ref 0.1–1.0)
MONOS PCT: 5.2 % (ref 3.0–12.0)
NEUTROS ABS: 6.2 10*3/uL (ref 1.4–7.7)
Neutrophils Relative %: 73.5 % (ref 43.0–77.0)
PLATELETS: 322 10*3/uL (ref 150.0–400.0)
RBC: 5.18 Mil/uL — ABNORMAL HIGH (ref 3.87–5.11)
RDW: 13.3 % (ref 11.5–15.5)
WBC: 8.5 10*3/uL (ref 4.0–10.5)

## 2014-02-07 LAB — HIGH SENSITIVITY CRP: CRP HIGH SENSITIVITY: 12.65 mg/L — AB (ref 0.000–5.000)

## 2014-02-07 LAB — IGA: IgA: 106 mg/dL (ref 68–378)

## 2014-02-07 LAB — VITAMIN D 25 HYDROXY (VIT D DEFICIENCY, FRACTURES): VITD: 26.84 ng/mL — AB (ref 30.00–100.00)

## 2014-02-07 LAB — VITAMIN B12: VITAMIN B 12: 212 pg/mL (ref 211–911)

## 2014-02-07 MED ORDER — VSL#3 DS PO PACK: PACK | ORAL | Status: DC

## 2014-02-07 MED ORDER — PEG-KCL-NACL-NASULF-NA ASC-C 100 G PO SOLR: 1.0000 | Freq: Once | ORAL | Status: DC

## 2014-02-07 MED ORDER — MESALAMINE 1.2 G PO TBEC: 4.8000 g | DELAYED_RELEASE_TABLET | Freq: Every day | ORAL | Status: DC

## 2014-02-07 NOTE — Patient Instructions (Signed)
You have been scheduled for a colonoscopy. Please follow written instructions given to you at your visit today.  Please pick up your prep kit at the pharmacy within the next 1-3 days. If you use inhalers (even only as needed), please bring them with you on the day of your procedure. Your physician has requested that you go to www.[email or web address] and enter the access code given to you at your visit today. This web site gives a general overview about your procedure. However, you should still follow specific instructions given to you by our office regarding your preparation for the procedure.  Go to the basement for labs today Your prescriptions are being sent to your pharmacy

## 2014-02-07 NOTE — Telephone Encounter (Signed)
Left message advising patient that I have mailed a coupon for 10 dollar rebate on Moviprep to her home address. She may call back with any further questions.

## 2014-02-07 NOTE — Progress Notes (Signed)
Subjective:    Patient ID: [name], female    DOB: 07/11/1980, 33 y.o.   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 She was last seen in January 2015 at which point she was improving from a significant flare in her IBD occurring for several months starting in September 2014. She did require a steroid taper to improve her symptoms though she has been off steroids since seeing me. She reports overall she is feeling well she is not having any abdominal pain or blood in her stool. She has continued Lialda 4.8 g daily. She does admit to having lost her medication during a move approximately 3 months ago and was without medication for 3 weeks. During this time she noted beginning abdominal discomfort and bloating. She started Uceris, which she had at home from a prior prescription and this helped. She then resumed Lialda at 4.8 g daily. She's been on it continuously for 2 months and feels symptom-free. Bowel movements seem to vary based on what she is they are normally formed it can be soft. They occur 2-6 times daily. She does report abdominal bloating which she feels is fluid retention. No lower extremity edema. She does not have regular periods because she has an IUD in place. She was given Maxide by her gynecologist to help with fluid and bloating. She uses this less than once per week and it seems to help. She denies oral ulcers, joint pains, rashes and ocular complaint. She recently had a flu shot and previously had Pneumovax.  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   Review of Systems As per history of present illness, otherwise negative  Current Medications, Allergies, Past Medical History, Past Surgical History, Family History and Social History were reviewed in [employer] record.     Objective:   Physical Exam BP 110/86  Pulse 80  Ht 5\' 4"  (1.626 m)  Wt 140 lb 3.2 oz (63.594 kg)  BMI 24.05 kg/m2 Constitutional: Well-developed and well-nourished. No distress. HEENT: Normocephalic and atraumatic. Oropharynx is clear and moist. No oropharyngeal exudate. Conjunctivae are normal.  No scleral icterus. Neck: Neck supple. Trachea midline. Cardiovascular: Normal rate, regular rhythm and intact distal pulses. No M/R/G Pulmonary/chest: Effort normal and breath sounds normal. No wheezing, rales or rhonchi. Abdominal: Soft, mild diffuse tenderness without localization, rebound or guarding, nondistended. Bowel sounds active throughout. There are no masses palpable. No hepatosplenomegaly. Extremities: no clubbing, cyanosis, or edema Lymphadenopathy: No cervical adenopathy noted. Neurological: Alert and oriented to person place and time. Skin: Skin is warm and dry. No rashes noted. Psychiatric: Normal mood and affect. Behavior is normal.  CBC    Component Value Date/Time   WBC 12.7* 05/11/2013 1022   RBC 5.04 05/11/2013 1022   HGB 14.4 05/11/2013 1022   HCT 42.8 05/11/2013 1022   PLT 359.0 05/11/2013 1022   MCV 85.0 05/11/2013 1022   MCH 27.7 01/19/2013 1106   MCHC 33.5 05/11/2013 1022   RDW 13.9 05/11/2013 1022   LYMPHSABS 1.7 01/19/2013 1106   MONOABS 0.5 01/19/2013 1106   EOSABS 0.1 01/19/2013 1106   BASOSABS 0.0 01/19/2013 1106    CMP     Component Value Date/Time   NA 139 05/11/2013 1022   K 3.8 05/11/2013 1022  CL 102 05/11/2013 1022   CO2 31 05/11/2013 1022   GLUCOSE 92 05/11/2013 1022   BUN 11 05/11/2013 1022   CREATININE 0.7 05/11/2013 1022   CALCIUM 9.5 05/11/2013 1022   PROT 6.5 05/11/2013 1022   ALBUMIN 3.9 05/11/2013 1022   AST 19 05/11/2013 1022   ALT 20 05/11/2013 1022   ALKPHOS 37* 05/11/2013 1022   BILITOT 1.4* 05/11/2013 1022   GFRNONAA >90 01/19/2013 1106   GFRAA >90 01/19/2013 1106   CT ABDOMEN AND PELVIS WITH CONTRAST -- Sept  2014   TECHNIQUE: Multidetector CT imaging of the abdomen and pelvis was performed using the standard protocol following bolus administration of intravenous contrast.   CONTRAST:  111mL OMNIPAQUE IOHEXOL 300 MG/ML IV. Oral contrast was also administered.   COMPARISON:  None.   FINDINGS: Wall thickening involving the entire descending colon and sigmoid colon, with adjacent edema/ inflammation in the surrounding fat. Small amount of ascites dependently in the pelvis and in the paracolic gutters. Scattered diverticula involving the sigmoid colon. Cecum, ascending colon, and transverse colon uninvolved. Distal ileum decompressed ; no definite small bowel involvement. No evidence of bowel obstruction. Normal appendix in the right upper pelvis. No free intraperitoneal air. Stomach normal in appearance containing food.   Sub 5 mm low-attenuation lesion consistent with a cyst in the right lobe of liver near the dome. Indeterminate approximate 0.7 x 0.8 x 1.3 cm low-attenuation lesion in the anterior segment right lobe of liver (series 2, image 22 and coronal image 57). No other focal hepatic parenchymal abnormalities. Normal-appearing spleen with 2 foci of accessory splenic tissue inferior to the tip of the spleen. Normal appearing pancreas, adrenal glands, kidneys, and gallbladder. No biliary ductal dilation. No visible aorto-iliofemoral atherosclerosis. Accessory renal arteries noted bilaterally. Visceral arteries patent. No significant lymphadenopathy.   Uterus normal in size and appearance containing an intrauterine device. Approximate 3 cm simple cyst arising from the right ovary. No other adnexal masses. Urinary bladder unremarkable.   Bone window images unremarkable apart from a broad-based central disc protrusion at L5-S1. Visualized lung bases clear apart from the expected dependent atelectasis posteriorly. Heart size normal.   IMPRESSION: 1. Inflammation involving the  entire descending colon and sigmoid colon. This may be infectious or a manifestation of an acute Crohn's flare. No convincing small bowel involvement. 2. Small amount of ascites dependently in the pelvis and in the paracolic gutters. 3. No evidence of bowel obstruction or free intraperitoneal air. 4. Indeterminate 1.3 cm right lobe liver lesion, statistically a small hemangioma.       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  1. IBD/indeterminate colitis (most consistent with UC) -- overall she's had a good year and she feels like she has achieved clinical remission. I have recommended repeat colonoscopy to evaluate disease activity. Her last colonoscopy was in 2012. In September of 2014 CT scan showed inflammation involving the entire descending and sigmoid colon. We previously had discussed escalation of therapy but she has not required steroids since late last year, other than the few days of colonic release budesonide she took 3 months ago. We discussed colonoscopy including the risks and benefits and she is agreeable to proceed. She will continue Lialda 4.8 g daily, though would plan to decrease to 2.4 g daily if endoscopic remission is found at the time of colonoscopy. I have recommended a trial of VSL#3 DS 1-2 packets per day to  help with her colitis and also bloating. --Labs today CBC, CMP, celiac panel, high-sensitivity CRP, B12 and vitamin D --Flu shot already given this year and up-to-date on Pneumovax  Return in 6 months, and for colonoscopy.

## 2014-02-11 ENCOUNTER — Other Ambulatory Visit: Payer: Self-pay

## 2014-02-11 ENCOUNTER — Telehealth: Payer: Self-pay | Admitting: Internal Medicine

## 2014-02-11 DIAGNOSIS — E538 Deficiency of other specified B group vitamins: Secondary | ICD-10-CM

## 2014-02-11 DIAGNOSIS — R7989 Other specified abnormal findings of blood chemistry: Secondary | ICD-10-CM

## 2014-02-11 LAB — TISSUE TRANSGLUTAMINASE, IGA: Tissue Transglutaminase Ab, IgA: 5 U/mL (ref <20)

## 2014-02-11 MED ORDER — VITAMIN D (ERGOCALCIFEROL) 1.25 MG (50000 UNIT) PO CAPS: 50000.0000 [IU] | ORAL_CAPSULE | ORAL | Status: DC

## 2014-02-11 NOTE — Telephone Encounter (Signed)
I have spoken to patient and she states that she has looked for Vitamin D 50000 units otc as previously advised to take but she is unable to find it. I advised that this is because vit. D 50000 units is a prescription strength. I have sent #8 tablets for her to take 1 tablet weekly x 8 weeks, then 1000 iu OTC thereafter. She verbalizes understanding and also knows she is due for repeat labwork in 3 months.

## 2014-02-15 ENCOUNTER — Encounter: Payer: Self-pay | Admitting: Internal Medicine

## 2014-03-06 ENCOUNTER — Other Ambulatory Visit: Payer: Self-pay | Admitting: Obstetrics and Gynecology

## 2014-03-07 LAB — CYTOLOGY - PAP

## 2014-03-19 ENCOUNTER — Encounter: Payer: 59 | Admitting: Internal Medicine

## 2014-04-10 ENCOUNTER — Encounter: Payer: Self-pay | Admitting: Internal Medicine

## 2014-04-10 ENCOUNTER — Ambulatory Visit (AMBULATORY_SURGERY_CENTER): Payer: 59 | Admitting: Internal Medicine

## 2014-04-10 VITALS — BP 108/55 | HR 52 | Temp 98.7°F | Resp 19 | Ht 64.0 in | Wt 145.0 lb

## 2014-04-10 DIAGNOSIS — K621 Rectal polyp: Secondary | ICD-10-CM

## 2014-04-10 DIAGNOSIS — K529 Noninfective gastroenteritis and colitis, unspecified: Secondary | ICD-10-CM

## 2014-04-10 DIAGNOSIS — K6389 Other specified diseases of intestine: Secondary | ICD-10-CM

## 2014-04-10 DIAGNOSIS — D128 Benign neoplasm of rectum: Secondary | ICD-10-CM

## 2014-04-10 DIAGNOSIS — D3A026 Benign carcinoid tumor of the rectum: Secondary | ICD-10-CM

## 2014-04-10 MED ORDER — SODIUM CHLORIDE 0.9 % IV SOLN: 500.0000 mL | INTRAVENOUS | Status: DC

## 2014-04-10 NOTE — Progress Notes (Signed)
Stable to RR 

## 2014-04-10 NOTE — Op Note (Signed)
[place]  [address]. [place], [id number]   COLONOSCOPY PROCEDURE REPORT  PATIENT: Kelli, Lewis  MR#: [id number] BIRTHDATE: 1980-07-16 , 33  yrs. old GENDER: female ENDOSCOPIST: Jerene Bears, MD PROCEDURE DATE:  04/10/2014 PROCEDURE:   Colonoscopy with biopsy and Colonoscopy with cold biopsy polypectomy First Screening Colonoscopy - Avg.  risk and is 50 yrs.  old or older - No.  Prior Negative Screening - Now for repeat screening. N/A  History of Adenoma - Now for follow-up colonoscopy & has been > or = to 3 yrs.  N/A  Polyps Removed Today? Yes. ASA CLASS:   Class II INDICATIONS:follow up for previously diagnosed indeterminate colitis diagnosed in 2012 (proctitis and cecal inflammation). MEDICATIONS: Monitored anesthesia care, Propofol 400 mg IV, and lidocaine 40 mg IV  DESCRIPTION OF PROCEDURE:   After the risks benefits and alternatives of the procedure were thoroughly explained, informed consent was obtained.  The digital rectal exam revealed no rectal mass.   The LB [id number]  endoscope was introduced through the anus and advanced to the terminal ileum which was intubated for a short distance. No adverse events experienced.   The quality of the prep was good, using MoviPrep  The instrument was then slowly withdrawn as the colon was fully examined.    COLON FINDINGS: Two small non-bleeding ulcers were found in the terminal ileum.  Biopsies were taken.  This appeared isolated without other ileitis. The colonic mucosa appeared normal throughout the entire examined colon with no endoscopic evidence of colitis.   A sessile polyp measuring 5 mm in size was found in the rectum.  A polypectomy was performed with cold forceps.  The resection was complete, the polyp tissue was completely retrieved and sent to histology.   There was mild diverticulosis noted in the ascending colon and sigmoid colon.  Retroflexed views revealed no abnormalities. The  time to cecum=4 minutes 12 seconds.  Withdrawal time=12 minutes 53 seconds.  The scope was withdrawn and the procedure completed. COMPLICATIONS: There were no immediate complications.  ENDOSCOPIC IMPRESSION: 1.   Two small ulcers were found in the terminal ileum; biopsies were taken 2.   The colonic mucosa appeared normal throughout the entire examined colon, no evidence of active IBD in the colon 3.   Sessile polyp was found in the rectum; polypectomy was performed with cold forceps 4.   Mild diverticulosis was noted in the ascending colon and sigmoid colon  RECOMMENDATIONS: 1.  Await biopsy results 2.  Continue current medications 3.  Office follow-up as previously recommended 4.  Avoid all NSAIDs  eSigned:  Jerene Bears, MD 04/10/2014 2:39 PM   cc: The Patient and Belinda Fisher, MD   PATIENT NAME:  Kelli, Lewis MR#: [id number]

## 2014-04-10 NOTE — Patient Instructions (Signed)
Avoid all NSAIDS (Aleve, Motrin, Ibuprofen, Advil, etc....). Biopsies taken. Polyp removed and diverticulosis seen. Handouts given on polyps, & diverticulosis. Continue current medications. Office follow up as previously recommended.  Call us with any questions or concerns. Thank you!  YOU HAD AN ENDOSCOPIC PROCEDURE TODAY AT [facility]: Refer to the procedure report that was given to you for any specific questions about what was found during the examination.  If the procedure report does not answer your questions, please call your gastroenterologist to clarify.  If you requested that your care partner not be given the details of your procedure findings, then the procedure report has been included in a sealed envelope for you to review at your convenience later.  YOU SHOULD EXPECT: Some feelings of bloating in the abdomen. Passage of more gas than usual.  Walking can help get rid of the air that was put into your GI tract during the procedure and reduce the bloating. If you had a lower endoscopy (such as a colonoscopy or flexible sigmoidoscopy) you may notice spotting of blood in your stool or on the toilet paper. If you underwent a bowel prep for your procedure, then you may not have a normal bowel movement for a few days.  DIET: Your first meal following the procedure should be a light meal and then it is ok to progress to your normal diet.  A half-sandwich or bowl of soup is an example of a good first meal.  Heavy or fried foods are harder to digest and may make you feel nauseous or bloated.  Likewise meals heavy in dairy and vegetables can cause extra gas to form and this can also increase the bloating.  Drink plenty of fluids but you should avoid alcoholic beverages for 24 hours.  ACTIVITY: Your care partner should take you home directly after the procedure.  You should plan to take it easy, moving slowly for the rest of the day.  You can resume normal activity the day after the  procedure however you should NOT DRIVE or use heavy machinery for 24 hours (because of the sedation medicines used during the test).    SYMPTOMS TO REPORT IMMEDIATELY: A gastroenterologist can be reached at any hour.  During normal business hours, 8:30 AM to 5:00 PM Monday through Friday, call [phone].  After hours and on weekends, please call the GI answering service at [phone] who will take a message and have the physician on call contact you.   Following lower endoscopy (colonoscopy or flexible sigmoidoscopy):  Excessive amounts of blood in the stool  Significant tenderness or worsening of abdominal pains  Swelling of the abdomen that is new, acute  Fever of 100F or higher  Following upper endoscopy (EGD)  Vomiting of blood or coffee ground material  New chest pain or pain under the shoulder blades  Painful or persistently difficult swallowing  New shortness of breath  Fever of 100F or higher  Black, tarry-looking stools  FOLLOW UP: If any biopsies were taken you will be contacted by phone or by letter within the next 1-3 weeks.  Call your gastroenterologist if you have not heard about the biopsies in 3 weeks.  Our staff will call the home number listed on your records the next business day following your procedure to check on you and address any questions or concerns that you may have at that time regarding the information given to you following your procedure. This is a courtesy call and so if  there is no answer at the home number and we have not heard from you through the emergency physician on call, we will assume that you have returned to your regular daily activities without incident.  SIGNATURES/CONFIDENTIALITY: You and/or your care partner have signed paperwork which will be entered into your electronic medical record.  These signatures attest to the fact that that the information above on your After Visit Summary has been reviewed and is understood.  Full  responsibility of the confidentiality of this discharge information lies with you and/or your care-partner.

## 2014-04-10 NOTE — Progress Notes (Signed)
Called to room to assist during endoscopic procedure.  Patient ID and intended procedure confirmed with present staff. Received instructions for my participation in the procedure from the performing physician.  

## 2014-04-11 ENCOUNTER — Telehealth: Payer: Self-pay | Admitting: *Deleted

## 2014-04-11 NOTE — Telephone Encounter (Signed)
  Follow up Call-  Call back number 04/10/2014  Post procedure Call Back phone  # [phone]  Permission to leave phone message Yes    [employer]

## 2014-04-18 ENCOUNTER — Telehealth: Payer: Self-pay | Admitting: Internal Medicine

## 2014-04-18 DIAGNOSIS — D3A026 Benign carcinoid tumor of the rectum: Secondary | ICD-10-CM

## 2014-04-18 NOTE — Telephone Encounter (Signed)
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 Oretha Caprice Discussed with patient by phone at length. Recommend flexible sigmoidoscopy for direct visualization and further resection if any polypoid tissue remains followed by rectal EUS to ensure no deeper tissue abnormality. Dr. Ardis Hughs will arrange this test Patient aware Small erosions/ulceration in the TI not consistent with Crohn's disease and no evidence of active acute or chronic inflammation. 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 She will hear from our office soon about flexible sigmoidoscopy and EUS

## 2014-04-18 NOTE — Telephone Encounter (Signed)
Pt calling for colonoscopy results. Please advise.

## 2014-04-22 ENCOUNTER — Other Ambulatory Visit: Payer: Self-pay

## 2014-04-22 DIAGNOSIS — D3A026 Benign carcinoid tumor of the rectum: Secondary | ICD-10-CM

## 2014-04-22 NOTE — Telephone Encounter (Signed)
Patty, She needs lower EUS, radial scope, moderate sedation OK.  Next Thursday (7th).  For rectal carcinoid.  Thanks  Laurena Slimmer get this set up.  Thanks

## 2014-04-22 NOTE — Telephone Encounter (Signed)
EUS scheduled, pt instructed and medications reviewed.  Patient instructions mailed to home.  Patient to call with any questions or concerns.  

## 2014-04-22 NOTE — Telephone Encounter (Signed)
05/02/14 130 pm [facility] eus Left message on machine to call back

## 2014-05-02 ENCOUNTER — Ambulatory Visit (HOSPITAL_COMMUNITY)
Admission: RE | Admit: 2014-05-02 | Discharge: 2014-05-02 | Disposition: A | Discharge status: Home / Self Care | Payer: 59 | Source: Ambulatory Visit | Attending: Gastroenterology | Admitting: Gastroenterology

## 2014-05-02 ENCOUNTER — Encounter (HOSPITAL_COMMUNITY)
Admission: RE | Disposition: A | Discharge status: Home / Self Care | Payer: Self-pay | Source: Ambulatory Visit | Attending: Gastroenterology

## 2014-05-02 ENCOUNTER — Encounter (HOSPITAL_COMMUNITY): Payer: Self-pay | Admitting: *Deleted

## 2014-05-02 DIAGNOSIS — Z85820 Personal history of malignant melanoma of skin: Secondary | ICD-10-CM | POA: Insufficient documentation

## 2014-05-02 DIAGNOSIS — D3A026 Benign carcinoid tumor of the rectum: Secondary | ICD-10-CM | POA: Insufficient documentation

## 2014-05-02 DIAGNOSIS — K5090 Crohn's disease, unspecified, without complications: Secondary | ICD-10-CM | POA: Diagnosis not present

## 2014-05-02 DIAGNOSIS — Z86010 Personal history of colonic polyps: Secondary | ICD-10-CM | POA: Diagnosis not present

## 2014-05-02 DIAGNOSIS — F419 Anxiety disorder, unspecified: Secondary | ICD-10-CM | POA: Insufficient documentation

## 2014-05-02 DIAGNOSIS — Z8379 Family history of other diseases of the digestive system: Secondary | ICD-10-CM | POA: Diagnosis not present

## 2014-05-02 HISTORY — PX: EUS: SHX5427

## 2014-05-02 SURGERY — ULTRASOUND, LOWER GI TRACT, ENDOSCOPIC
Anesthesia: Moderate Sedation

## 2014-05-02 MED ORDER — SODIUM CHLORIDE 0.9 % IV SOLN
INTRAVENOUS | Status: DC
  Administered 2014-05-02: 500 mL via INTRAVENOUS

## 2014-05-02 MED ORDER — FENTANYL CITRATE 0.05 MG/ML IJ SOLN
INTRAMUSCULAR | Status: DC | PRN
  Administered 2014-05-02: 12.5 ug via INTRAVENOUS
  Administered 2014-05-02 (×2): 25 ug via INTRAVENOUS
  Administered 2014-05-02: 12.5 ug via INTRAVENOUS

## 2014-05-02 MED ORDER — FLEET ENEMA 7-19 GM/118ML RE ENEM
ENEMA | RECTAL | Status: AC
  Filled 2014-05-02: qty 2

## 2014-05-02 MED ORDER — MIDAZOLAM HCL 10 MG/2ML IJ SOLN
INTRAMUSCULAR | Status: DC | PRN
  Administered 2014-05-02: 1 mg via INTRAVENOUS
  Administered 2014-05-02: 2 mg via INTRAVENOUS
  Administered 2014-05-02 (×2): 1 mg via INTRAVENOUS

## 2014-05-02 MED ORDER — FLEET ENEMA 7-19 GM/118ML RE ENEM
2.0000 | ENEMA | Freq: Once | RECTAL | Status: AC
  Administered 2014-05-02: 2 via RECTAL

## 2014-05-02 MED ORDER — MIDAZOLAM HCL 10 MG/2ML IJ SOLN
INTRAMUSCULAR | Status: AC
  Filled 2014-05-02: qty 2

## 2014-05-02 MED ORDER — FENTANYL CITRATE 0.05 MG/ML IJ SOLN
INTRAMUSCULAR | Status: AC
  Filled 2014-05-02: qty 2

## 2014-05-02 MED ORDER — DIPHENHYDRAMINE HCL 50 MG/ML IJ SOLN
INTRAMUSCULAR | Status: AC
  Filled 2014-05-02: qty 1

## 2014-05-02 NOTE — Op Note (Signed)
[facility] [place], [address]   ENDOSCOPIC ULTRASOUND PROCEDURE REPORT  PATIENT: Kelli Lewis, Kelli Lewis  MR#: [id number] BIRTHDATE: 26-Nov-1980  GENDER: female ENDOSCOPIST: Milus Banister, MD REFERRED BY:  Zenovia Jarred, MD PROCEDURE DATE:  05/02/2014 PROCEDURE:   Lower EUS, flex sigmoidoscopy with snare removal polyp site ASA CLASS:      Class II INDICATIONS:   recent colonoscopy Dr.  Hilarie Fredrickson, cold snare removal small rectal polyp that was shown to be small (55mm) carcinoid tumor). MEDICATIONS: Fentanyl 75 mcg IV and Versed 5 mg IV  DESCRIPTION OF PROCEDURE:   After the risks benefits and alternatives of the procedure were  explained, informed consent was obtained. The patient was then placed in the left, lateral, decubitus postion and IV sedation was administered. Throughout the procedure, the patients blood pressure, pulse and oxygen saturations were monitored continuously.  Under direct visualization, the [employer]ax EUS Radial [id number]  endoscope was introduced through the anus  and advanced to the sigmoid colon . Water was used as necessary to provide an acoustic interface.  Upon completion of the imaging, water was removed and the patient was sent to the recovery room in satisfactory condition.   Sigmoidoscopic findings: 1. The site of recent cold snare polypectomy was subtle but was clearly located about 4-5cm from the anal verge.  There was no clear residual neoplastic mucosa and no submucosal bulging. Following EUS evaluation, I performed snare/cautery of the site, removing the defect caused by previous polypectomy. 2. The examination to the sigmoid colon was otherwise normal.  EUS findings: 1. The site of previous polypectomy was not appreciable by EUS.  The echolayering of the rectum was completely normal appearing. 2. No perirectal adenopathy.  ENDOSCOPIC IMPRESSION: The site of recent rectal carcinoid snare resection was located clearly,  there were no abnormal lesions, masses at the site by EUS. The site was then resected using snare/cautery.  RECOMMENDATIONS: Await final pathology.  This may warrant surveillance flexible sigmoidoscopy in 6-12 months.  _______________________________ eSigned:  Milus Banister, MD 2014-05-02 [id number]

## 2014-05-02 NOTE — H&P (Signed)
HPI: This is a woman with incidentally noted small rectal carcinoid tumor.  May have been completely resected with snare removal recently but path not clear about margins    Past Medical History  Diagnosis Date  . Duodenal diverticulum   . Melanoma of buttock   . Herpes   . Family history of malignant neoplasm of gastrointestinal tract   . Crohn's disease   . Colon polyps   . Allergy   . Anxiety   . Heart murmur     Past Surgical History  Procedure Laterality Date  . Melanoma excision      Current Facility-Administered Medications  Medication Dose Route Frequency Provider Last Rate Last Dose  . 0.9 %  sodium chloride infusion   Intravenous Continuous Milus Banister, MD 20 mL/hr at 05/02/14 1335 500 mL at 05/02/14 1335  . sodium phosphate (FLEET) 7-19 GM/118ML enema 2 enema  2 enema Rectal Once Milus Banister, MD        Allergies as of 04/22/2014  . (No Known Allergies)    Family History  Problem Relation Age of Onset  . Colon cancer Paternal Grandmother   . Breast cancer Paternal Grandmother   . Crohn's disease Paternal Grandmother   . Parkinsonism Father   . Colon polyps Father   . Irritable bowel syndrome Mother   . Esophageal cancer Neg Hx   . Stomach cancer Neg Hx   . Prostate cancer Neg Hx     History   Social History  . Marital Status: Single    Spouse Name: N/A    Number of Children: 1  . Years of Education: N/A   Occupational History  . CPA    Social History Main Topics  . Smoking status: Never Smoker   . Smokeless tobacco: Never Used  . Alcohol Use: Yes     Comment: once monthly  . Drug Use: No  . Sexual Activity: Not on file   Other Topics Concern  . Not on file   Social History Narrative      Physical Exam: BP 128/96 mmHg  Pulse 76  Temp(Src) 98.1 F (36.7 C) (Oral)  Resp 16  Ht 5\' 3"  (1.6 m)  Wt 148 lb (67.132 kg)  BMI 26.22 kg/m2  SpO2 100% Constitutional: generally well-appearing Psychiatric: alert and oriented  x3 Abdomen: soft, nontender, nondistended, no obvious ascites, no peritoneal signs, normal bowel sounds     Assessment and plan: 34 y.o. female with rectal carcinoid  For lower EUS, possibly further snare resection of the site

## 2014-05-02 NOTE — Discharge Instructions (Signed)

## 2014-05-03 ENCOUNTER — Encounter (HOSPITAL_COMMUNITY): Payer: Self-pay | Admitting: Gastroenterology

## 2014-05-07 ENCOUNTER — Telehealth: Payer: Self-pay

## 2014-05-07 NOTE — Telephone Encounter (Signed)
-----   Message from Maury Dus, RN sent at 02/11/2014  9:49 AM EDT ----- Regarding: Labs Needs B12 and Vit D level in Jan,  Orders in epic.

## 2014-05-07 NOTE — Telephone Encounter (Signed)
Left message for pt to call back  °

## 2014-05-08 NOTE — Telephone Encounter (Signed)
Pt aware.

## 2014-05-09 ENCOUNTER — Telehealth: Payer: Self-pay | Admitting: Internal Medicine

## 2014-05-09 NOTE — Telephone Encounter (Signed)
Pt calling for path report. Please advise.

## 2014-05-10 NOTE — Telephone Encounter (Signed)
Kelli Lewis called back to discuss surgical referral after our conversation yesterday. Second resection performed by Dr. Ardis Hughs reveals residual carcinoid tumor in the rectum. I recommend surgical resection, transanal should be possible, to provide a wider resection margin and to ensure no residual carcinoid tumor She prefers Dr. Neysa Bonito I will make referral.  Second and new issue, this morning she passed what she says is a considerable amount of red and dark red blood per rectum. Painless. Past 2 additional episodes of much smaller volume red blood. This raises the question of post polypectomy bleeding from the rectum. We will monitor for now, she is asymptomatic. I asked her to let us know immediately if she continues to bleed anymore today or if it recurs significantly through the weekend. If so she may require flexible sigmoidoscopy for Endo clipping. I gave her my cell phone number to contact me if necessary

## 2014-05-13 ENCOUNTER — Telehealth: Payer: Self-pay

## 2014-05-13 NOTE — Telephone Encounter (Signed)
Pt scheduled to see Dr. Johney Maine 05/15/14@12noon , arrival time 11:30am. Pt aware of appt and instructions per Dr. Johney Maine, see note below.

## 2014-05-13 NOTE — Telephone Encounter (Signed)
Pt has OV with Dr. Johney Maine at noon on 05/15/14.

## 2014-05-13 NOTE — Telephone Encounter (Signed)
-----   Message from Jerene Bears, MD sent at 05/13/2014  7:20 AM EST ----- Regarding: FW: Rectal carcinoid Vaughan Basta Please call [facility] to get Dyonna in to see Neysa Bonito for rectal carcinoid. As per below he is okay with overbooking. Thank you Ulice Dash  ----- Message -----    From: Michael Boston, MD    Sent: 05/10/2014   5:55 PM      To: Illene Regulus, Jerene Bears, MD Subject: RE: Rectal carcinoid                           Most definitive way to get good margins is transanal TEM partial proctectomy of scar with 1-2 centimeter margins to get full-thickness excision.  That should take care of it.  Challenge be in making sure that the right area is removed.  Probably need to tattoo the spot with just a little spot on it and make sure that we know exactly where this thing is.  How far from anus.  Anterior/posterior.  Do not want to overdo or miss anything.    See if I can see this in office next week.  Okay to overbook if the front office gives pushback.  Probably would be a good idea for patient to have enema just before seeing me so I can do an anoscopy or flex sigmoidoscopy in office to confirm the location of this thing if I cannot feel the scar.  Tattoo it maybe as well  ----- Message -----    From: Jerene Bears, MD    Sent: 05/10/2014   5:16 PM      To: Michael Boston, MD Subject: Rectal carcinoid                               Richardson Landry This is the patient I left a message about Rectal carcinoid.  Not seen at EUS, but there on large hot snare resection. Question is transanal excision at the scar to ensure complete resection given its presence now twice on endoscopic removal. Thanks for your opinion Ulice Dash

## 2014-05-15 ENCOUNTER — Other Ambulatory Visit (INDEPENDENT_AMBULATORY_CARE_PROVIDER_SITE_OTHER): Payer: Self-pay | Admitting: Surgery

## 2014-05-15 NOTE — H&P (Signed)
[name]. Ward 05/15/2014 12:25 PM Location: [facility] Patient #: [id number] DOB: [birth year] Single / Language: Cleophus Molt / Race: White Female History of Present Illness Adin Hector MD; 05/15/2014 1:11 PM) Patient words: eval rectal ca.  The patient is a 34 year old female who presents with gastric cancer. Patient sent by Dr. 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 Pleasant woman with Crohn's disease. Followed by gastroenterology. Controlled on Lialda for over a year. Underwent colonoscopy to evaluate ileum. That region looked okay. Nodule noted in the distal rectum. Biopsy consistent with carcinoid. Sent for endorectal ultrasound. Scar noted. No deeper mass noted. Scar biopsy. Persisting carcinoid. Because of concerned, surgical consultation requested to get better margins. Patient normally has 6 bowel movements a day. Loose. No severe diarrhea. Has function with this for many years. No history of fissures or fistulas. No history of abscesses. Some mild joint aches in the morning but not severe. No history of mouth ulceration. No recent Crohn's flares. She had a CT scan in September 2014 with a colon flare in the left colon. Small hemangioma noted in liver but no other abnormalities. Other Problems Marjean Donna, CMA; 05/15/2014 12:25 PM) Anxiety Disorder Crohn's Disease Diverticulosis Heart murmur Hemorrhoids Lump In Breast Melanoma Migraine Headache Other disease, cancer, significant illness Ulcerative Colitis  Past Surgical History Marjean Donna, CMA; 05/15/2014 12:25 PM) Colon Polyp Removal - Colonoscopy  Diagnostic Studies History Marjean Donna, CMA; 05/15/2014 12:25 PM) Colonoscopy within last year Mammogram >3 years ago Pap Smear 1-5 years ago  Allergies Marjean Donna, CMA; 05/15/2014 12:26 PM) No Known Drug Allergies 05/15/2014  Medication History (Sonya Bynum, CMA; 05/15/2014 12:26 PM) ALPRAZolam (0.25MG   Tablet, Oral) Active. Lialda (1.2GM Tablet DR, Oral) Active.  Social History Marjean Donna, [place]; 05/15/2014 12:25 PM) Alcohol use Occasional alcohol use. Caffeine use Carbonated beverages, Tea. No drug use Tobacco use Never smoker.  Family History Marjean Donna, CMA; 05/15/2014 12:25 PM) Breast Cancer Family Members In General. Colon Cancer Family Members In General. Colon Polyps Father. Diabetes Mellitus Father. Hypertension Mother. Ischemic Bowel Disease Family Members In General. Melanoma Family Members In General. Migraine Headache Sister. Rectal Cancer Family Members In General.  Pregnancy / Birth History Marjean Donna, CMA; 05/15/2014 12:25 PM) Age at menarche 39 years. Contraceptive History Intrauterine device. Gravida 1 Irregular periods Maternal age 64-30 Para 1     Review of Systems ([place]; 05/15/2014 12:25 PM) General Present- Chills, Fatigue and Weight Gain. Not Present- Appetite Loss, Fever, Night Sweats and Weight Loss. Skin Not Present- Change in Wart/Mole, Dryness, Hives, Jaundice, New Lesions, Non-Healing Wounds, Rash and Ulcer. HEENT Present- Seasonal Allergies and Wears glasses/contact lenses. Not Present- Earache, Hearing Loss, Hoarseness, Nose Bleed, Oral Ulcers, Ringing in the Ears, Sinus Pain, Sore Throat, Visual Disturbances and Yellow Eyes. Respiratory Present- Snoring. Not Present- Bloody sputum, Chronic Cough, Difficulty Breathing and Wheezing. Breast Not Present- Breast Mass, Breast Pain, Nipple Discharge and Skin Changes. Cardiovascular Present- Palpitations. Not Present- Chest Pain, Difficulty Breathing Lying Down, Leg Cramps, Rapid Heart Rate, Shortness of Breath and Swelling of Extremities. Gastrointestinal Present- Abdominal Pain, Bloating, Change in Bowel Habits, Excessive gas, Gets full quickly at meals and Rectal Pain. Not Present- Bloody Stool, Chronic diarrhea, Constipation, Difficulty Swallowing, Hemorrhoids,  Indigestion, Nausea and Vomiting. Female Genitourinary Present- Nocturia. Not Present- Frequency, Painful Urination, Pelvic Pain and Urgency. Musculoskeletal Present- Joint Pain and Joint Stiffness. Not Present- Back Pain, Muscle Pain, Muscle Weakness and Swelling of Extremities. Neurological Not Present- Decreased Memory, Fainting,  Headaches, Numbness, Seizures, Tingling, Tremor, Trouble walking and Weakness. Psychiatric Present- Anxiety. Not Present- Bipolar, Change in Sleep Pattern, Depression, Fearful and Frequent crying. Endocrine Not Present- Cold Intolerance, Excessive Hunger, Hair Changes, Heat Intolerance, Hot flashes and New Diabetes. Hematology Not Present- Easy Bruising, Excessive bleeding, Gland problems, HIV and Persistent Infections.  Vitals (Sonya Bynum CMA; 05/15/2014 12:25 PM) 05/15/2014 12:25 PM Weight: 147 lb Height: 64in Body Surface Area: 1.74 m Body Mass Index: 25.23 kg/m Temp.: 97.23F(Temporal)  Pulse: 77 (Regular)  BP: 126/78 (Sitting, Left Arm, Standard)     Physical Exam Adin Hector MD; 05/15/2014 12:52 PM)  General Mental Status-Alert. General Appearance-Not in acute distress, Not Sickly. Orientation-Oriented X3. Hydration-Well hydrated. Voice-Normal.  Integumentary Global Assessment Upon inspection and palpation of skin surfaces of the - Axillae: non-tender, no inflammation or ulceration, no drainage. and Distribution of scalp and body hair is normal. General Characteristics Temperature - normal warmth is noted.  Head and Neck Head-normocephalic, atraumatic with no lesions or palpable masses. Face Global Assessment - atraumatic, no absence of expression. Neck Global Assessment - no abnormal movements, no bruit auscultated on the right, no bruit auscultated on the left, no decreased range of motion, non-tender. Trachea-midline. Thyroid Gland Characteristics - non-tender.  Eye Eyeball - Left-Extraocular movements  intact, No Nystagmus. Eyeball - Right-Extraocular movements intact, No Nystagmus. Cornea - Left-No Hazy. Cornea - Right-No Hazy. Sclera/Conjunctiva - Left-No scleral icterus, No Discharge. Sclera/Conjunctiva - Right-No scleral icterus, No Discharge. Pupil - Left-Direct reaction to light normal. Pupil - Right-Direct reaction to light normal.  ENMT Ears Pinna - Left - no drainage observed, no generalized tenderness observed. Right - no drainage observed, no generalized tenderness observed. Nose and Sinuses External Inspection of the Nose - no destructive lesion observed. Inspection of the nares - Left - quiet respiration. Right - quiet respiration. Mouth and Throat Lips - Upper Lip - no fissures observed, no pallor noted. Lower Lip - no fissures observed, no pallor noted. Nasopharynx - no discharge present. Oral Cavity/Oropharynx - Tongue - no dryness observed. Oral Mucosa - no cyanosis observed. Hypopharynx - no evidence of airway distress observed.  Chest and Lung Exam Inspection Movements - Normal and Symmetrical. Accessory muscles - No use of accessory muscles in breathing. Palpation Palpation of the chest reveals - Non-tender. Auscultation Breath sounds - Normal and Clear.  Cardiovascular Auscultation Rhythm - Regular. Murmurs & Other Heart Sounds - Auscultation of the heart reveals - No Murmurs and No Systolic Clicks.  Abdomen Inspection Inspection of the abdomen reveals - No Visible peristalsis and No Abnormal pulsations. Umbilicus - No Bleeding, No Urine drainage. Palpation/Percussion Palpation and Percussion of the abdomen reveal - Soft, Non Tender, No Rebound tenderness, No Rigidity (guarding) and No Cutaneous hyperesthesia.  Female Genitourinary Sexual Maturity Tanner 5 - Adult hair pattern. Note: No vaginal bleeding nor discharge. Normal external female genitalia.   Rectal Note: Perianal skin clean. No pruritus. No fissure. No fistula. Normal  sphincter tone. No easily palpable masses. Mild fullness felt right anterior rectal wall 4 cm from anal verge.  Rigid proctoscopy notes some red dotting and mild scarring right anterior rectal wall 4 cm from anal verge near area palpated. Seems to correspond with location on colonoscopy and endorectal ultrasound. Cervix bulge easily seen on colonoscopy. Confirms anterior location.   Peripheral Vascular Upper Extremity Inspection - Left - No Cyanotic nailbeds, Not Ischemic. Right - No Cyanotic nailbeds, Not Ischemic.  Neurologic Neurologic evaluation reveals -normal attention span and ability to concentrate, able to  name objects and repeat phrases. Appropriate fund of knowledge , normal sensation and normal coordination. Mental Status Affect - not angry, not paranoid. Cranial Nerves-Normal Bilaterally. Gait-Normal.  Neuropsychiatric Mental status exam performed with findings of-able to articulate well with normal speech/language, rate, volume and coherence, thought content normal with ability to perform basic computations and apply abstract reasoning and no evidence of hallucinations, delusions, obsessions or homicidal/suicidal ideation.  Musculoskeletal Global Assessment Spine, Ribs and Pelvis - no instability, subluxation or laxity. Right Upper Extremity - no instability, subluxation or laxity.  Lymphatic Head & Neck  General Head & Neck Lymphatics: Bilateral - Description - No Localized lymphadenopathy. Axillary  General Axillary Region: Bilateral - Description - No Localized lymphadenopathy. Femoral & Inguinal  Generalized Femoral & Inguinal Lymphatics: Left - Description - No Localized lymphadenopathy. Right - Description - No Localized lymphadenopathy.    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 Right anterior aspect over RV septum. 4?5 centimeters  from anal verge.  Because this is less than 2 cm in size, better local resection with better margin should be all that is needed. Would recommend TEM partial proctectomy. Plan prone positioning. May have to readjust positioning depending on intraoperative findings with better visualization. Would like to try and do this soon so that the scar location is not lost. Normally would get tattooing but unfortunately tattooed will spread to the entire region and not be very useful.  Current Plans Schedule for Surgery Pt Education - [facility] Colon Bowel Prep 2015 Miralax/Antibiotics Started Neomycin Sulfate 500MG , 2 (two) Tablet SEE NOTE, #6, 05/15/2014, No Refill. Local Order: TAKE TWO TABLETS AT 2 PM, 3 PM, AND 10 PM THE DAY PRIOR TO SURGERY Started Flagyl 500MG , 2 (two) Tablet SEE NOTE, #6, 05/15/2014, No Refill. Local Order: Take at 2pm, 3pm, and 10pm the day prior to your colon operation Pt Education - [facility] TEM Education (Keyonia Gluth): discussed with patient and provided information. Pt Education - [facility] Rectal Surgery HCI (Teddy Pena): discussed with patient and provided information. Pt Education - [facility] Good Bowel Health (Lauris Keepers) The anatomy & physiology of the digestive tract was discussed. The pathophysiology of the rectal pathology was discussed. Natural history risks without surgery was discussed. I feel the risks of no intervention will lead to serious problems that outweigh the operative risks; therefore, I recommended surgery.  Laparoscopic & open abdominal techniques were discussed. I recommended we start with a partial proctectomy by transanal endoscopic microsurgery (TEM) for excisional biopsy to remove the pathology and hopefully cure and/or control the pathology. This technique can offer less operative risk and faster post-operative recovery. Possible need for immediate or later abdominal surgery for further treatment was discussed.  Risks such as bleeding, abscess, reoperation, ostomy, heart attack, death,  and other risks were discussed. I noted a good likelihood this will help address the problem. Goals of post-operative recovery were discussed as well. We will work to minimize complications. An educational handout was given as well. Questions were answered. The patient expresses understanding & wishes to proceed with surgery.

## 2014-05-21 ENCOUNTER — Encounter (HOSPITAL_COMMUNITY): Payer: Self-pay

## 2014-05-21 ENCOUNTER — Encounter (HOSPITAL_COMMUNITY)
Admission: RE | Admit: 2014-05-21 | Discharge: 2014-05-21 | Disposition: A | Discharge status: Home / Self Care | Payer: 59 | Source: Ambulatory Visit | Attending: Surgery | Admitting: Surgery

## 2014-05-21 DIAGNOSIS — Z86010 Personal history of colonic polyps: Secondary | ICD-10-CM | POA: Diagnosis not present

## 2014-05-21 DIAGNOSIS — Z800 Family history of malignant neoplasm of digestive organs: Secondary | ICD-10-CM | POA: Diagnosis not present

## 2014-05-21 DIAGNOSIS — K589 Irritable bowel syndrome without diarrhea: Secondary | ICD-10-CM | POA: Diagnosis not present

## 2014-05-21 DIAGNOSIS — K626 Ulcer of anus and rectum: Secondary | ICD-10-CM | POA: Diagnosis not present

## 2014-05-21 DIAGNOSIS — F419 Anxiety disorder, unspecified: Secondary | ICD-10-CM | POA: Diagnosis not present

## 2014-05-21 DIAGNOSIS — C7A026 Malignant carcinoid tumor of the rectum: Secondary | ICD-10-CM | POA: Diagnosis not present

## 2014-05-21 DIAGNOSIS — G43909 Migraine, unspecified, not intractable, without status migrainosus: Secondary | ICD-10-CM | POA: Diagnosis not present

## 2014-05-21 DIAGNOSIS — B009 Herpesviral infection, unspecified: Secondary | ICD-10-CM | POA: Diagnosis not present

## 2014-05-21 DIAGNOSIS — R011 Cardiac murmur, unspecified: Secondary | ICD-10-CM | POA: Diagnosis not present

## 2014-05-21 DIAGNOSIS — J45909 Unspecified asthma, uncomplicated: Secondary | ICD-10-CM | POA: Diagnosis not present

## 2014-05-21 DIAGNOSIS — Z85820 Personal history of malignant melanoma of skin: Secondary | ICD-10-CM | POA: Diagnosis not present

## 2014-05-21 DIAGNOSIS — K219 Gastro-esophageal reflux disease without esophagitis: Secondary | ICD-10-CM | POA: Diagnosis not present

## 2014-05-21 HISTORY — DX: Unspecified asthma, uncomplicated: J45.909

## 2014-05-21 HISTORY — DX: Headache: R51

## 2014-05-21 HISTORY — DX: Gastro-esophageal reflux disease without esophagitis: K21.9

## 2014-05-21 HISTORY — DX: Headache, unspecified: R51.9

## 2014-05-21 LAB — BASIC METABOLIC PANEL
Anion gap: 6 (ref 5–15)
BUN: 13 mg/dL (ref 6–23)
CALCIUM: 8.9 mg/dL (ref 8.4–10.5)
CO2: 28 mmol/L (ref 19–32)
Chloride: 107 mmol/L (ref 96–112)
Creatinine, Ser: 0.65 mg/dL (ref 0.50–1.10)
GFR calc Af Amer: >90 mL/min (ref >90)
GFR calc non Af Amer: >90 mL/min (ref >90)
GLUCOSE: 100 mg/dL — AB (ref 70–99)
Potassium: 4.1 mmol/L (ref 3.5–5.1)
Sodium: 141 mmol/L (ref 135–145)

## 2014-05-21 LAB — CBC
HEMATOCRIT: 39.9 % (ref 36.0–46.0)
Hemoglobin: 13.4 g/dL (ref 12.0–15.0)
MCH: 27.9 pg (ref 26.0–34.0)
MCHC: 33.6 g/dL (ref 30.0–36.0)
MCV: 83 fL (ref 78.0–100.0)
Platelets: 276 10*3/uL (ref 150–400)
RBC: 4.81 MIL/uL (ref 3.87–5.11)
RDW: 12.7 % (ref 11.5–15.5)
WBC: 5.5 10*3/uL (ref 4.0–10.5)

## 2014-05-21 LAB — ABO/RH: ABO/RH(D): A POS

## 2014-05-21 LAB — HCG, SERUM, QUALITATIVE: Preg, Serum: NEGATIVE

## 2014-05-21 NOTE — Patient Instructions (Signed)
[place]  05/21/2014   Your procedure is scheduled on:     Thursday, May 23, 2014   Report to [facility] Main Entrance and follow signs to  [facility] arrive at 1:00 PM.   Call this number if you have problems the morning of surgery [phone] or Presurgical Testing [phone].   Remember:  Do not eat food After Midnight but may take clear liquids till 9:00am day of surgery then nothing by mouth..    Take these medicines the morning of surgery with A SIP OF WATER: Singular if needed                                                 You may not have any metal on your body including hair pins and piercings  Do not wear jewelry, make-up, lotions, powders, prefumes or deodorant.  Do not shave body hair  48 hours(2 days) of CHG soap use.                Do not bring valuables to the hospital. [place].  Contacts, dentures or bridgework may not be worn into surgery.  Leave suitcase in the car. After surgery it may be brought to your room.  For patients admitted to the hospital, checkout time is 11:00 AM the day of discharge.     Special Instructions: review fact sheets for Blood Transfusion fact sheet.  Remember: Type/Screen "Blue armsbands"- may not be removed once applied(would result in being retested AM of surgery, if removed). ________________________________________________________________________  [facility] - Preparing for Surgery Before surgery, you can play an important role.  Because skin is not sterile, your skin needs to be as free of germs as possible.  You can reduce the number of germs on your skin by washing with CHG (chlorahexidine gluconate) soap before surgery.  CHG is an antiseptic cleaner which kills germs and bonds with the skin to continue killing germs even after washing. Please DO NOT use if you have an allergy to CHG or antibacterial soaps.  If your skin becomes reddened/irritated stop using the CHG  and inform your nurse when you arrive at Short Stay. Do not shave (including legs and underarms) for at least 48 hours prior to the first CHG shower.  You may shave your face/neck. Please follow these instructions carefully:  1.  Shower with CHG Soap the night before surgery and the  morning of Surgery.  2.  If you choose to wash your hair, wash your hair first as usual with your  normal  shampoo.  3.  After you shampoo, rinse your hair and body thoroughly to remove the  shampoo.                           4.  Use CHG as you would any other liquid soap.  You can apply chg directly  to the skin and wash                       Gently with a scrungie or clean washcloth.  5.  Apply the CHG Soap to your body ONLY FROM THE NECK DOWN.   Do not use on face/ open  Wound or open sores. Avoid contact with eyes, ears mouth and genitals (private parts).                       Wash face,  Genitals (private parts) with your normal soap.             6.  Wash thoroughly, paying special attention to the area where your surgery  will be performed.  7.  Thoroughly rinse your body with warm water from the neck down.  8.  DO NOT shower/wash with your normal soap after using and rinsing off  the CHG Soap.                9.  Pat yourself dry with a clean towel.            10.  Wear clean pajamas.            11.  Place clean sheets on your bed the night of your first shower and do not  sleep with pets. Day of Surgery : Do not apply any lotions/deodorants the morning of surgery.  Please wear clean clothes to the hospital/surgery center.  FAILURE TO FOLLOW THESE INSTRUCTIONS MAY RESULT IN THE CANCELLATION OF YOUR SURGERY PATIENT SIGNATURE_________________________________  NURSE SIGNATURE__________________________________  ________________________________________________________________________    CLEAR LIQUID DIET   Foods Allowed                                                                      Foods Excluded  Coffee and tea, regular and decaf                             liquids that you cannot  Plain Jell-O in any flavor                                             see through such as: Fruit ices (not with fruit pulp)                                     milk, soups, orange juice  Iced Popsicles                                    All solid food Carbonated beverages, regular and diet                                    Cranberry, grape and apple juices Sports drinks like Gatorade Lightly seasoned clear broth or consume(fat free) Sugar, honey syrup  Sample Menu Breakfast                                Lunch  Supper Cranberry juice                    Beef broth                            Chicken broth Jell-O                                     Grape juice                           Apple juice Coffee or tea                        Jell-O                                      Popsicle                                                Coffee or tea                        Coffee or tea  _____________________________________________________________________

## 2014-05-23 ENCOUNTER — Encounter (HOSPITAL_COMMUNITY): Payer: Self-pay | Admitting: *Deleted

## 2014-05-23 ENCOUNTER — Encounter (HOSPITAL_COMMUNITY): Payer: Self-pay | Admitting: Anesthesiology

## 2014-05-23 ENCOUNTER — Encounter (HOSPITAL_COMMUNITY)
Admission: RE | Disposition: A | Discharge status: Home / Self Care | Payer: Self-pay | Source: Ambulatory Visit | Attending: Surgery

## 2014-05-23 ENCOUNTER — Inpatient Hospital Stay (HOSPITAL_COMMUNITY): Payer: 59 | Admitting: Anesthesiology

## 2014-05-23 ENCOUNTER — Observation Stay (HOSPITAL_COMMUNITY)
Admission: RE | Admit: 2014-05-23 | Discharge: 2014-05-24 | Disposition: A | Discharge status: Home / Self Care | Payer: 59 | Source: Ambulatory Visit | Attending: Surgery | Admitting: Surgery

## 2014-05-23 DIAGNOSIS — J45909 Unspecified asthma, uncomplicated: Secondary | ICD-10-CM | POA: Insufficient documentation

## 2014-05-23 DIAGNOSIS — B009 Herpesviral infection, unspecified: Secondary | ICD-10-CM | POA: Insufficient documentation

## 2014-05-23 DIAGNOSIS — K589 Irritable bowel syndrome without diarrhea: Secondary | ICD-10-CM | POA: Insufficient documentation

## 2014-05-23 DIAGNOSIS — Z800 Family history of malignant neoplasm of digestive organs: Secondary | ICD-10-CM | POA: Insufficient documentation

## 2014-05-23 DIAGNOSIS — G43909 Migraine, unspecified, not intractable, without status migrainosus: Secondary | ICD-10-CM | POA: Insufficient documentation

## 2014-05-23 DIAGNOSIS — C7A026 Malignant carcinoid tumor of the rectum: Secondary | ICD-10-CM | POA: Diagnosis not present

## 2014-05-23 DIAGNOSIS — F419 Anxiety disorder, unspecified: Secondary | ICD-10-CM | POA: Insufficient documentation

## 2014-05-23 DIAGNOSIS — K626 Ulcer of anus and rectum: Secondary | ICD-10-CM | POA: Insufficient documentation

## 2014-05-23 DIAGNOSIS — D3A026 Benign carcinoid tumor of the rectum: Secondary | ICD-10-CM

## 2014-05-23 DIAGNOSIS — Z85820 Personal history of malignant melanoma of skin: Secondary | ICD-10-CM | POA: Insufficient documentation

## 2014-05-23 DIAGNOSIS — R011 Cardiac murmur, unspecified: Secondary | ICD-10-CM | POA: Insufficient documentation

## 2014-05-23 DIAGNOSIS — K529 Noninfective gastroenteritis and colitis, unspecified: Secondary | ICD-10-CM | POA: Diagnosis present

## 2014-05-23 DIAGNOSIS — Z86010 Personal history of colonic polyps: Secondary | ICD-10-CM | POA: Insufficient documentation

## 2014-05-23 DIAGNOSIS — K219 Gastro-esophageal reflux disease without esophagitis: Secondary | ICD-10-CM | POA: Insufficient documentation

## 2014-05-23 HISTORY — PX: PARTIAL PROCTECTOMY BY TEM: SHX6011

## 2014-05-23 HISTORY — DX: Anal fissure, unspecified: K60.2

## 2014-05-23 LAB — TYPE AND SCREEN
ABO/RH(D): A POS
Antibody Screen: NEGATIVE

## 2014-05-23 SURGERY — PARTIAL PROCTECTOMY BY TEM
Anesthesia: General | Site: Rectum

## 2014-05-23 MED ORDER — HYDROMORPHONE HCL 1 MG/ML IJ SOLN: 0.5000 mg | INTRAMUSCULAR | Status: DC | PRN

## 2014-05-23 MED ORDER — KETOROLAC TROMETHAMINE 30 MG/ML IJ SOLN
INTRAMUSCULAR | Status: DC | PRN
  Administered 2014-05-23: 30 mg via INTRAVENOUS

## 2014-05-23 MED ORDER — DEXAMETHASONE SODIUM PHOSPHATE 10 MG/ML IJ SOLN
INTRAMUSCULAR | Status: AC
  Filled 2014-05-23: qty 1

## 2014-05-23 MED ORDER — LIP MEDEX EX OINT: 1.0000 "application " | TOPICAL_OINTMENT | Freq: Two times a day (BID) | CUTANEOUS | Status: DC

## 2014-05-23 MED ORDER — DEXTROSE 5 % IV SOLN
2.0000 g | Freq: Two times a day (BID) | INTRAVENOUS | Status: AC
  Administered 2014-05-24: 2 g via INTRAVENOUS
  Filled 2014-05-23: qty 2

## 2014-05-23 MED ORDER — METOPROLOL TARTRATE 1 MG/ML IV SOLN
5.0000 mg | Freq: Four times a day (QID) | INTRAVENOUS | Status: DC | PRN
  Filled 2014-05-23: qty 5

## 2014-05-23 MED ORDER — KETOROLAC TROMETHAMINE 30 MG/ML IJ SOLN
INTRAMUSCULAR | Status: AC
  Filled 2014-05-23: qty 1

## 2014-05-23 MED ORDER — ALPRAZOLAM 0.25 MG PO TABS
0.2500 mg | ORAL_TABLET | Freq: Every day | ORAL | Status: DC
  Administered 2014-05-23: 0.25 mg via ORAL
  Filled 2014-05-23: qty 1

## 2014-05-23 MED ORDER — LACTATED RINGERS IV BOLUS (SEPSIS): 1000.0000 mL | Freq: Three times a day (TID) | INTRAVENOUS | Status: DC | PRN

## 2014-05-23 MED ORDER — SACCHAROMYCES BOULARDII 250 MG PO CAPS
250.0000 mg | ORAL_CAPSULE | Freq: Two times a day (BID) | ORAL | Status: DC
  Administered 2014-05-23 – 2014-05-24 (×2): 250 mg via ORAL
  Filled 2014-05-23 (×3): qty 1

## 2014-05-23 MED ORDER — SODIUM CHLORIDE 0.9 % IJ SOLN
INTRAMUSCULAR | Status: AC
  Filled 2014-05-23: qty 10

## 2014-05-23 MED ORDER — NEOSTIGMINE METHYLSULFATE 10 MG/10ML IV SOLN
INTRAVENOUS | Status: DC | PRN
  Administered 2014-05-23: 4 mg via INTRAVENOUS

## 2014-05-23 MED ORDER — ALUM & MAG HYDROXIDE-SIMETH 200-200-20 MG/5ML PO SUSP: 30.0000 mL | Freq: Four times a day (QID) | ORAL | Status: DC | PRN

## 2014-05-23 MED ORDER — DIPHENHYDRAMINE HCL 50 MG/ML IJ SOLN
12.5000 mg | Freq: Four times a day (QID) | INTRAMUSCULAR | Status: DC | PRN
Start: 2014-05-23 — End: 2014-05-24

## 2014-05-23 MED ORDER — ONDANSETRON HCL 4 MG/2ML IJ SOLN
INTRAMUSCULAR | Status: AC
  Filled 2014-05-23: qty 2

## 2014-05-23 MED ORDER — HYDROMORPHONE HCL 1 MG/ML IJ SOLN
INTRAMUSCULAR | Status: DC | PRN
  Administered 2014-05-23 (×2): .4 mg via INTRAVENOUS

## 2014-05-23 MED ORDER — DIPHENHYDRAMINE HCL 12.5 MG/5ML PO ELIX: 12.5000 mg | ORAL_SOLUTION | Freq: Four times a day (QID) | ORAL | Status: DC | PRN

## 2014-05-23 MED ORDER — SUCCINYLCHOLINE CHLORIDE 20 MG/ML IJ SOLN
INTRAMUSCULAR | Status: DC | PRN
  Administered 2014-05-23: 80 mg via INTRAVENOUS

## 2014-05-23 MED ORDER — CISATRACURIUM BESYLATE (PF) 10 MG/5ML IV SOLN
INTRAVENOUS | Status: DC | PRN
Start: 2014-05-23 — End: 2014-05-23
  Administered 2014-05-23: 6 mg via INTRAVENOUS
  Administered 2014-05-23: 4 mg via INTRAVENOUS
  Administered 2014-05-23 (×2): 2 mg via INTRAVENOUS

## 2014-05-23 MED ORDER — PROMETHAZINE HCL 25 MG/ML IJ SOLN
6.2500 mg | INTRAMUSCULAR | Status: DC | PRN
  Administered 2014-05-23: 6.25 mg via INTRAVENOUS

## 2014-05-23 MED ORDER — BUPIVACAINE-EPINEPHRINE 0.25% -1:200000 IJ SOLN
INTRAMUSCULAR | Status: AC
  Filled 2014-05-23: qty 1

## 2014-05-23 MED ORDER — SODIUM CHLORIDE 0.9 % IV SOLN
INTRAVENOUS | Status: DC
  Administered 2014-05-24: 03:00:00 via INTRAVENOUS

## 2014-05-23 MED ORDER — HYDROMORPHONE HCL 2 MG/ML IJ SOLN
INTRAMUSCULAR | Status: AC
  Filled 2014-05-23: qty 1

## 2014-05-23 MED ORDER — HEPARIN SODIUM (PORCINE) 5000 UNIT/ML IJ SOLN
5000.0000 [IU] | Freq: Three times a day (TID) | INTRAMUSCULAR | Status: DC
  Filled 2014-05-23 (×4): qty 1

## 2014-05-23 MED ORDER — SUFENTANIL CITRATE 50 MCG/ML IV SOLN
INTRAVENOUS | Status: DC | PRN
  Administered 2014-05-23: 20 ug via INTRAVENOUS
  Administered 2014-05-23 (×3): 10 ug via INTRAVENOUS

## 2014-05-23 MED ORDER — EPHEDRINE SULFATE 50 MG/ML IJ SOLN
INTRAMUSCULAR | Status: AC
  Filled 2014-05-23: qty 1

## 2014-05-23 MED ORDER — SODIUM CHLORIDE 0.9 % IJ SOLN
3.0000 mL | Freq: Two times a day (BID) | INTRAMUSCULAR | Status: DC
  Administered 2014-05-23: 3 mL via INTRAVENOUS

## 2014-05-23 MED ORDER — SUFENTANIL CITRATE 50 MCG/ML IV SOLN
INTRAVENOUS | Status: AC
  Filled 2014-05-23: qty 1

## 2014-05-23 MED ORDER — SODIUM CHLORIDE 0.9 % IV SOLN: 250.0000 mL | INTRAVENOUS | Status: DC | PRN

## 2014-05-23 MED ORDER — CHLORHEXIDINE GLUCONATE 4 % EX LIQD: 60.0000 mL | Freq: Once | CUTANEOUS | Status: DC

## 2014-05-23 MED ORDER — LIDOCAINE HCL (CARDIAC) 20 MG/ML IV SOLN
INTRAVENOUS | Status: DC | PRN
  Administered 2014-05-23: 100 mg via INTRAVENOUS

## 2014-05-23 MED ORDER — ONDANSETRON HCL 4 MG/2ML IJ SOLN
INTRAMUSCULAR | Status: DC | PRN
  Administered 2014-05-23: 4 mg via INTRAVENOUS

## 2014-05-23 MED ORDER — METOPROLOL TARTRATE 12.5 MG HALF TABLET
12.5000 mg | ORAL_TABLET | Freq: Two times a day (BID) | ORAL | Status: DC | PRN
  Filled 2014-05-23: qty 1

## 2014-05-23 MED ORDER — GLYCOPYRROLATE 0.2 MG/ML IJ SOLN
INTRAMUSCULAR | Status: AC
  Filled 2014-05-23: qty 3

## 2014-05-23 MED ORDER — DEXAMETHASONE SODIUM PHOSPHATE 10 MG/ML IJ SOLN
INTRAMUSCULAR | Status: DC | PRN
  Administered 2014-05-23: 10 mg via INTRAVENOUS

## 2014-05-23 MED ORDER — PHENOL 1.4 % MT LIQD: 2.0000 | OROMUCOSAL | Status: DC | PRN

## 2014-05-23 MED ORDER — PROMETHAZINE HCL 25 MG/ML IJ SOLN: 6.2500 mg | INTRAMUSCULAR | Status: DC | PRN

## 2014-05-23 MED ORDER — OXYCODONE HCL 5 MG PO TABS: 5.0000 mg | ORAL_TABLET | Freq: Once | ORAL | Status: DC | PRN

## 2014-05-23 MED ORDER — SODIUM CHLORIDE 0.9 % IJ SOLN: 3.0000 mL | INTRAMUSCULAR | Status: DC | PRN

## 2014-05-23 MED ORDER — OXYCODONE HCL 5 MG/5ML PO SOLN
5.0000 mg | Freq: Once | ORAL | Status: DC | PRN
  Filled 2014-05-23: qty 5

## 2014-05-23 MED ORDER — BUPIVACAINE LIPOSOME 1.3 % IJ SUSP
20.0000 mL | INTRAMUSCULAR | Status: DC
  Filled 2014-05-23: qty 20

## 2014-05-23 MED ORDER — PROPOFOL 10 MG/ML IV BOLUS
INTRAVENOUS | Status: AC
  Filled 2014-05-23: qty 20

## 2014-05-23 MED ORDER — ONDANSETRON HCL 4 MG/2ML IJ SOLN: 4.0000 mg | Freq: Four times a day (QID) | INTRAMUSCULAR | Status: DC | PRN

## 2014-05-23 MED ORDER — ONDANSETRON HCL 4 MG PO TABS: 4.0000 mg | ORAL_TABLET | Freq: Four times a day (QID) | ORAL | Status: DC | PRN

## 2014-05-23 MED ORDER — MENTHOL 3 MG MT LOZG: 1.0000 | LOZENGE | OROMUCOSAL | Status: DC | PRN

## 2014-05-23 MED ORDER — EPHEDRINE SULFATE 50 MG/ML IJ SOLN
INTRAMUSCULAR | Status: DC | PRN
  Administered 2014-05-23: 5 mg via INTRAVENOUS

## 2014-05-23 MED ORDER — GLYCOPYRROLATE 0.2 MG/ML IJ SOLN
INTRAMUSCULAR | Status: DC | PRN
  Administered 2014-05-23: .6 mg via INTRAVENOUS

## 2014-05-23 MED ORDER — MIDAZOLAM HCL 2 MG/2ML IJ SOLN
INTRAMUSCULAR | Status: AC
  Filled 2014-05-23: qty 2

## 2014-05-23 MED ORDER — OXYCODONE HCL 5 MG PO TABS: 5.0000 mg | ORAL_TABLET | Freq: Four times a day (QID) | ORAL | Status: DC | PRN

## 2014-05-23 MED ORDER — HYDROMORPHONE HCL 1 MG/ML IJ SOLN: 0.2500 mg | INTRAMUSCULAR | Status: DC | PRN

## 2014-05-23 MED ORDER — CEFOTETAN DISODIUM-DEXTROSE 2-2.08 GM-% IV SOLR
INTRAVENOUS | Status: AC
  Filled 2014-05-23: qty 50

## 2014-05-23 MED ORDER — MONTELUKAST SODIUM 10 MG PO TABS
10.0000 mg | ORAL_TABLET | Freq: Every day | ORAL | Status: DC | PRN
  Filled 2014-05-23: qty 1

## 2014-05-23 MED ORDER — ALVIMOPAN 12 MG PO CAPS
12.0000 mg | ORAL_CAPSULE | Freq: Two times a day (BID) | ORAL | Status: DC
  Administered 2014-05-24: 12 mg via ORAL
  Filled 2014-05-23 (×2): qty 1

## 2014-05-23 MED ORDER — MAGIC MOUTHWASH
15.0000 mL | Freq: Four times a day (QID) | ORAL | Status: DC | PRN
  Filled 2014-05-23: qty 15

## 2014-05-23 MED ORDER — MIDAZOLAM HCL 5 MG/5ML IJ SOLN
INTRAMUSCULAR | Status: DC | PRN
  Administered 2014-05-23 (×2): 1 mg via INTRAVENOUS

## 2014-05-23 MED ORDER — LACTATED RINGERS IV SOLN
INTRAVENOUS | Status: DC
  Administered 2014-05-23 (×2): via INTRAVENOUS
  Administered 2014-05-23: 1000 mL via INTRAVENOUS

## 2014-05-23 MED ORDER — ACETAMINOPHEN 500 MG PO TABS
1000.0000 mg | ORAL_TABLET | Freq: Three times a day (TID) | ORAL | Status: DC
  Administered 2014-05-23 – 2014-05-24 (×2): 1000 mg via ORAL
  Filled 2014-05-23 (×4): qty 2

## 2014-05-23 MED ORDER — LACTATED RINGERS IR SOLN
Status: DC | PRN
  Administered 2014-05-23: 3000 mL

## 2014-05-23 MED ORDER — SODIUM CHLORIDE 0.9 % IJ SOLN
INTRAMUSCULAR | Status: AC
  Filled 2014-05-23: qty 20

## 2014-05-23 MED ORDER — PROMETHAZINE HCL 25 MG/ML IJ SOLN
INTRAMUSCULAR | Status: AC
  Filled 2014-05-23: qty 1

## 2014-05-23 MED ORDER — 0.9 % SODIUM CHLORIDE (POUR BTL) OPTIME
TOPICAL | Status: DC | PRN
  Administered 2014-05-23: 1000 mL

## 2014-05-23 MED ORDER — CEFOTETAN DISODIUM 2 G IJ SOLR
2.0000 g | INTRAMUSCULAR | Status: AC
  Administered 2014-05-23: 2 g via INTRAVENOUS
  Filled 2014-05-23: qty 2

## 2014-05-23 MED ORDER — OXYCODONE HCL 5 MG PO TABS
5.0000 mg | ORAL_TABLET | ORAL | Status: DC | PRN
  Administered 2014-05-24 (×2): 5 mg via ORAL
  Filled 2014-05-23 (×2): qty 1

## 2014-05-23 MED ORDER — ACETAMINOPHEN 10 MG/ML IV SOLN
1000.0000 mg | Freq: Once | INTRAVENOUS | Status: AC
  Administered 2014-05-23: 1000 mg via INTRAVENOUS
  Filled 2014-05-23: qty 100

## 2014-05-23 MED ORDER — BUPIVACAINE-EPINEPHRINE 0.25% -1:200000 IJ SOLN
INTRAMUSCULAR | Status: DC | PRN
  Administered 2014-05-23: 50 mL

## 2014-05-23 MED ORDER — BUPIVACAINE LIPOSOME 1.3 % IJ SUSP
20.0000 mL | Freq: Once | INTRAMUSCULAR | Status: DC
  Filled 2014-05-23: qty 20

## 2014-05-23 MED ORDER — CISATRACURIUM BESYLATE 20 MG/10ML IV SOLN
INTRAVENOUS | Status: AC
  Filled 2014-05-23: qty 10

## 2014-05-23 MED ORDER — MESALAMINE 1.2 G PO TBEC
4.8000 g | DELAYED_RELEASE_TABLET | Freq: Every evening | ORAL | Status: DC
  Filled 2014-05-23: qty 4

## 2014-05-23 MED ORDER — BUPIVACAINE-EPINEPHRINE (PF) 0.25% -1:200000 IJ SOLN
INTRAMUSCULAR | Status: AC
  Filled 2014-05-23: qty 30

## 2014-05-23 MED ORDER — LIDOCAINE HCL (CARDIAC) 20 MG/ML IV SOLN
INTRAVENOUS | Status: AC
  Filled 2014-05-23: qty 5

## 2014-05-23 MED ORDER — PROPOFOL 10 MG/ML IV BOLUS
INTRAVENOUS | Status: DC | PRN
  Administered 2014-05-23: 150 mg via INTRAVENOUS

## 2014-05-23 MED ORDER — HEPARIN SODIUM (PORCINE) 5000 UNIT/ML IJ SOLN
5000.0000 [IU] | Freq: Once | INTRAMUSCULAR | Status: AC
  Administered 2014-05-23: 5000 [IU] via SUBCUTANEOUS
  Filled 2014-05-23: qty 1

## 2014-05-23 SURGICAL SUPPLY — 58 items
BLADE SURG 15 STRL LF DISP TIS (BLADE) ×1 IMPLANT
BLADE SURG 15 STRL SS (BLADE) ×1
BRIEF STRETCH FOR OB PAD LRG (UNDERPADS AND DIAPERS) ×2 IMPLANT
CABLE HIGH FREQUENCY MONO STRZ (ELECTRODE) ×4 IMPLANT
DRAPE C-ARM 42X120 X-RAY (DRAPES) IMPLANT
DRAPE CAMERA CLOSED 9X96 (DRAPES) ×2 IMPLANT
DRAPE LAPAROTOMY T 102X78X121 (DRAPES) ×2 IMPLANT
DRAPE WARM FLUID 44X44 (DRAPE) ×2 IMPLANT
DRSG PAD ABDOMINAL 8X10 ST (GAUZE/BANDAGES/DRESSINGS) IMPLANT
ELECT REM PT RETURN 9FT ADLT (ELECTROSURGICAL) ×2
ELECTRODE REM PT RTRN 9FT ADLT (ELECTROSURGICAL) ×1 IMPLANT
GAUZE SPONGE 4X4 12PLY STRL (GAUZE/BANDAGES/DRESSINGS) ×2 IMPLANT
GAUZE SPONGE 4X4 16PLY XRAY LF (GAUZE/BANDAGES/DRESSINGS) ×2 IMPLANT
GLOVE BIOGEL PI IND STRL 8.5 (GLOVE) ×2 IMPLANT
GLOVE BIOGEL PI INDICATOR 8.5 (GLOVE) ×2
GLOVE ECLIPSE 8.0 STRL XLNG CF (GLOVE) ×4 IMPLANT
GLOVE INDICATOR 8.0 STRL GRN (GLOVE) ×4 IMPLANT
GLOVE SURG SS PI 8.5 STRL IVOR (GLOVE) ×2
GLOVE SURG SS PI 8.5 STRL STRW (GLOVE) ×2 IMPLANT
GOWN STRL REUS W/TWL 2XL LVL3 (GOWN DISPOSABLE) ×4 IMPLANT
GOWN STRL REUS W/TWL XL LVL3 (GOWN DISPOSABLE) ×4 IMPLANT
IV LACTATED RINGER IRRG 3000ML (IV SOLUTION) ×1
IV LACTATED RINGERS 1000ML (IV SOLUTION) IMPLANT
IV LR IRRIG 3000ML ARTHROMATIC (IV SOLUTION) ×1 IMPLANT
KIT BASIN OR (CUSTOM PROCEDURE TRAY) ×2 IMPLANT
LEGGING LITHOTOMY PAIR STRL (DRAPES) ×2 IMPLANT
LUBRICANT JELLY K Y 4OZ (MISCELLANEOUS) ×2 IMPLANT
NEEDLE HYPO 22GX1.5 SAFETY (NEEDLE) ×2 IMPLANT
NS IRRIG 1000ML POUR BTL (IV SOLUTION) ×2 IMPLANT
PACK BASIC VI WITH GOWN DISP (CUSTOM PROCEDURE TRAY) ×2 IMPLANT
PENCIL BUTTON HOLSTER BLD 10FT (ELECTRODE) ×2 IMPLANT
RETRACTOR STAY HOOK 5MM (MISCELLANEOUS) IMPLANT
RETRACTOR WILSON SYSTEM (INSTRUMENTS) IMPLANT
SCISSORS LAP 5X35 DISP (ENDOMECHANICALS) ×2 IMPLANT
SET IRRIG TUBING LAPAROSCOPIC (IRRIGATION / IRRIGATOR) ×2 IMPLANT
SHEARS HARMONIC ACE PLUS 36CM (ENDOMECHANICALS) ×2 IMPLANT
STOPCOCK 4 WAY LG BORE MALE ST (IV SETS) ×2 IMPLANT
SUT PDS AB 2-0 CT2 27 (SUTURE) IMPLANT
SUT PDS AB 3-0 SH 27 (SUTURE) ×4 IMPLANT
SUT SILK 2 0 (SUTURE)
SUT SILK 2 0 SH CR/8 (SUTURE) IMPLANT
SUT SILK 2-0 18XBRD TIE 12 (SUTURE) IMPLANT
SUT SILK 3 0 SH 30 (SUTURE) IMPLANT
SUT SILK 3 0 SH CR/8 (SUTURE) IMPLANT
SUT V-LOC BARB 180 2/0GR6 GS22 (SUTURE) ×4
SUT VIC AB 2-0 UR6 27 (SUTURE) IMPLANT
SUT VIC AB 3-0 SH 27 (SUTURE)
SUT VIC AB 3-0 SH 27XBRD (SUTURE) IMPLANT
SUTURE V-LC BRB 180 2/0GR6GS22 (SUTURE) ×2 IMPLANT
SYR 20CC LL (SYRINGE) ×2 IMPLANT
SYR BULB IRRIGATION 50ML (SYRINGE) IMPLANT
TOWEL OR 17X26 10 PK STRL BLUE (TOWEL DISPOSABLE) ×2 IMPLANT
TOWEL OR NON WOVEN STRL DISP B (DISPOSABLE) ×2 IMPLANT
TRAY FOLEY CATH 14FRSI W/METER (CATHETERS) ×2 IMPLANT
TUBING CONNECTING 10 (TUBING) IMPLANT
TUBING INSUFFLATION 10FT LAP (TUBING) ×2 IMPLANT
WATER STERILE IRR 1500ML POUR (IV SOLUTION) ×2 IMPLANT
YANKAUER SUCT BULB TIP 10FT TU (MISCELLANEOUS) ×2 IMPLANT

## 2014-05-23 NOTE — H&P (View-Only) (Signed)
Kelli Lewis. Ward 05/15/2014 12:25 PM Location: [facility] Patient #: [id number] DOB: [birth year] Single / Language: Kelli Lewis / Race: White Female History of Present Illness Kelli Lewis; 05/15/2014 1:11 PM) Patient words: eval rectal ca.  The patient is a 34 year old female who presents with gastric cancer. Patient sent by Kelli Lewis with gastroenterology for concern of rectal carcinoid. Pleasant woman with Crohn's disease. Followed by gastroenterology. Controlled on Lialda for over a year. Underwent colonoscopy to evaluate ileum. That region looked okay. Nodule noted in the distal rectum. Biopsy consistent with carcinoid. Sent for endorectal ultrasound. Scar noted. No deeper mass noted. Scar biopsy. Persisting carcinoid. Because of concerned, surgical consultation requested to get better margins. Patient normally has 6 bowel movements a day. Loose. No severe diarrhea. Has function with this for many years. No history of fissures or fistulas. No history of abscesses. Some mild joint aches in the morning but not severe. No history of mouth ulceration. No recent Crohn's flares. She had a CT scan in September 2014 with a colon flare in the left colon. Small hemangioma noted in liver but no other abnormalities. Other Problems Kelli Lewis, CMA; 05/15/2014 12:25 PM) Anxiety Disorder Crohn's Disease Diverticulosis Heart murmur Hemorrhoids Lump In Breast Melanoma Migraine Headache Other disease, cancer, significant illness Ulcerative Colitis  Past Surgical History Kelli Lewis, CMA; 05/15/2014 12:25 PM) Colon Polyp Removal - Colonoscopy  Diagnostic Studies History Kelli Lewis, CMA; 05/15/2014 12:25 PM) Colonoscopy within last year Mammogram >3 years ago Pap Smear 1-5 years ago  Allergies Kelli Lewis, CMA; 05/15/2014 12:26 PM) No Known Drug Allergies 05/15/2014  Medication History (Kelli Lewis, CMA; 05/15/2014 12:26 PM) ALPRAZolam (0.25MG   Tablet, Oral) Active. Lialda (1.2GM Tablet DR, Oral) Active.  Social History Kelli Lewis, Kelli Lewis; 05/15/2014 12:25 PM) Alcohol use Occasional alcohol use. Caffeine use Carbonated beverages, Tea. No drug use Tobacco use Never smoker.  Family History Kelli Lewis, CMA; 05/15/2014 12:25 PM) Breast Cancer Family Members In General. Colon Cancer Family Members In General. Colon Polyps Father. Diabetes Mellitus Father. Hypertension Mother. Ischemic Bowel Disease Family Members In General. Melanoma Family Members In General. Migraine Headache Sister. Rectal Cancer Family Members In General.  Pregnancy / Birth History Kelli Lewis, CMA; 05/15/2014 12:25 PM) Age at menarche 36 years. Contraceptive History Intrauterine device. Gravida 1 Irregular periods Maternal age 31-30 Para 1     Review of Systems (Kelli Lewis; 05/15/2014 12:25 PM) General Present- Chills, Fatigue and Weight Gain. Not Present- Appetite Loss, Fever, Night Sweats and Weight Loss. Skin Not Present- Change in Wart/Mole, Dryness, Hives, Jaundice, New Lesions, Non-Healing Wounds, Rash and Ulcer. HEENT Present- Seasonal Allergies and Wears glasses/contact lenses. Not Present- Earache, Hearing Loss, Hoarseness, Nose Bleed, Oral Ulcers, Ringing in the Ears, Sinus Pain, Sore Throat, Visual Disturbances and Yellow Eyes. Respiratory Present- Snoring. Not Present- Bloody sputum, Chronic Cough, Difficulty Breathing and Wheezing. Breast Not Present- Breast Mass, Breast Pain, Nipple Discharge and Skin Changes. Cardiovascular Present- Palpitations. Not Present- Chest Pain, Difficulty Breathing Lying Down, Leg Cramps, Rapid Heart Rate, Shortness of Breath and Swelling of Extremities. Gastrointestinal Present- Abdominal Pain, Bloating, Change in Bowel Habits, Excessive gas, Gets full quickly at meals and Rectal Pain. Not Present- Bloody Stool, Chronic diarrhea, Constipation, Difficulty Swallowing, Hemorrhoids,  Indigestion, Nausea and Vomiting. Female Genitourinary Present- Nocturia. Not Present- Frequency, Painful Urination, Pelvic Pain and Urgency. Musculoskeletal Present- Joint Pain and Joint Stiffness. Not Present- Back Pain, Muscle Pain, Muscle Weakness and Swelling of Extremities. Neurological Not Present- Decreased Memory, Fainting,  Headaches, Numbness, Seizures, Tingling, Tremor, Trouble walking and Weakness. Psychiatric Present- Anxiety. Not Present- Bipolar, Change in Sleep Pattern, Depression, Fearful and Frequent crying. Endocrine Not Present- Cold Intolerance, Excessive Hunger, Hair Changes, Heat Intolerance, Hot flashes and New Diabetes. Hematology Not Present- Easy Bruising, Excessive bleeding, Gland problems, HIV and Persistent Infections.  Vitals (Kelli Lewis CMA; 05/15/2014 12:25 PM) 05/15/2014 12:25 PM Weight: 147 lb Height: 64in Body Surface Area: 1.74 m Body Mass Index: 25.23 kg/m Temp.: 97.75F(Temporal)  Pulse: 77 (Regular)  BP: 126/78 (Sitting, Left Arm, Standard)     Physical Exam Kelli Lewis; 05/15/2014 12:52 PM)  General Mental Status-Alert. General Appearance-Not in acute distress, Not Sickly. Orientation-Oriented X3. Hydration-Well hydrated. Voice-Normal.  Integumentary Global Assessment Upon inspection and palpation of skin surfaces of the - Axillae: non-tender, no inflammation or ulceration, no drainage. and Distribution of scalp and body hair is normal. General Characteristics Temperature - normal warmth is noted.  Head and Neck Head-normocephalic, atraumatic with no lesions or palpable masses. Face Global Assessment - atraumatic, no absence of expression. Neck Global Assessment - no abnormal movements, no bruit auscultated on the right, no bruit auscultated on the left, no decreased range of motion, non-tender. Trachea-midline. Thyroid Gland Characteristics - non-tender.  Eye Eyeball - Left-Extraocular movements  intact, No Nystagmus. Eyeball - Right-Extraocular movements intact, No Nystagmus. Cornea - Left-No Hazy. Cornea - Right-No Hazy. Sclera/Conjunctiva - Left-No scleral icterus, No Discharge. Sclera/Conjunctiva - Right-No scleral icterus, No Discharge. Pupil - Left-Direct reaction to light normal. Pupil - Right-Direct reaction to light normal.  ENMT Ears Pinna - Left - no drainage observed, no generalized tenderness observed. Right - no drainage observed, no generalized tenderness observed. Nose and Sinuses External Inspection of the Nose - no destructive lesion observed. Inspection of the nares - Left - quiet respiration. Right - quiet respiration. Mouth and Throat Lips - Upper Lip - no fissures observed, no pallor noted. Lower Lip - no fissures observed, no pallor noted. Nasopharynx - no discharge present. Oral Cavity/Oropharynx - Tongue - no dryness observed. Oral Mucosa - no cyanosis observed. Hypopharynx - no evidence of airway distress observed.  Chest and Lung Exam Inspection Movements - Normal and Symmetrical. Accessory muscles - No use of accessory muscles in breathing. Palpation Palpation of the chest reveals - Non-tender. Auscultation Breath sounds - Normal and Clear.  Cardiovascular Auscultation Rhythm - Regular. Murmurs & Other Heart Sounds - Auscultation of the heart reveals - No Murmurs and No Systolic Clicks.  Abdomen Inspection Inspection of the abdomen reveals - No Visible peristalsis and No Abnormal pulsations. Umbilicus - No Bleeding, No Urine drainage. Palpation/Percussion Palpation and Percussion of the abdomen reveal - Soft, Non Tender, No Rebound tenderness, No Rigidity (guarding) and No Cutaneous hyperesthesia.  Female Genitourinary Sexual Maturity Tanner 5 - Adult hair pattern. Note: No vaginal bleeding nor discharge. Normal external female genitalia.   Rectal Note: Perianal skin clean. No pruritus. No fissure. No fistula. Normal  sphincter tone. No easily palpable masses. Mild fullness felt right anterior rectal wall 4 cm from anal verge.  Rigid proctoscopy notes some red dotting and mild scarring right anterior rectal wall 4 cm from anal verge near area palpated. Seems to correspond with location on colonoscopy and endorectal ultrasound. Cervix bulge easily seen on colonoscopy. Confirms anterior location.   Peripheral Vascular Upper Extremity Inspection - Left - No Cyanotic nailbeds, Not Ischemic. Right - No Cyanotic nailbeds, Not Ischemic.  Neurologic Neurologic evaluation reveals -normal attention span and ability to concentrate, able to  name objects and repeat phrases. Appropriate fund of knowledge , normal sensation and normal coordination. Mental Status Affect - not angry, not paranoid. Cranial Nerves-Normal Bilaterally. Gait-Normal.  Neuropsychiatric Mental status exam performed with findings of-able to articulate well with normal speech/language, rate, volume and coherence, thought content normal with ability to perform basic computations and apply abstract reasoning and no evidence of hallucinations, delusions, obsessions or homicidal/suicidal ideation.  Musculoskeletal Global Assessment Spine, Ribs and Pelvis - no instability, subluxation or laxity. Right Upper Extremity - no instability, subluxation or laxity.  Lymphatic Head & Neck  General Head & Neck Lymphatics: Bilateral - Description - No Localized lymphadenopathy. Axillary  General Axillary Region: Bilateral - Description - No Localized lymphadenopathy. Femoral & Inguinal  Generalized Femoral & Inguinal Lymphatics: Left - Description - No Localized lymphadenopathy. Right - Description - No Localized lymphadenopathy.    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 Right anterior aspect over RV septum. 4?5 centimeters  from anal verge.  Because this is less than 2 cm in size, better local resection with better margin should be all that is needed. Would recommend TEM partial proctectomy. Plan prone positioning. May have to readjust positioning depending on intraoperative findings with better visualization. Would like to try and do this soon so that the scar location is not lost. Normally would get tattooing but unfortunately tattooed will spread to the entire region and not be very useful.  Current Plans Schedule for Surgery Pt Education - [facility] Colon Bowel Prep 2015 Miralax/Antibiotics Started Neomycin Sulfate 500MG , 2 (two) Tablet SEE NOTE, #6, 05/15/2014, No Refill. Local Order: TAKE TWO TABLETS AT 2 PM, 3 PM, AND 10 PM THE DAY PRIOR TO SURGERY Started Flagyl 500MG , 2 (two) Tablet SEE NOTE, #6, 05/15/2014, No Refill. Local Order: Take at 2pm, 3pm, and 10pm the day prior to your colon operation Pt Education - [facility] TEM Education (Alisson Rozell): discussed with patient and provided information. Pt Education - [facility] Rectal Surgery HCI (Drenda Sobecki): discussed with patient and provided information. Pt Education - [facility] Good Bowel Health (Robley Matassa) The anatomy & physiology of the digestive tract was discussed. The pathophysiology of the rectal pathology was discussed. Natural history risks without surgery was discussed. I feel the risks of no intervention will lead to serious problems that outweigh the operative risks; therefore, I recommended surgery.  Laparoscopic & open abdominal techniques were discussed. I recommended we start with a partial proctectomy by transanal endoscopic microsurgery (TEM) for excisional biopsy to remove the pathology and hopefully cure and/or control the pathology. This technique can offer less operative risk and faster post-operative recovery. Possible need for immediate or later abdominal surgery for further treatment was discussed.  Risks such as bleeding, abscess, reoperation, ostomy, heart attack, death,  and other risks were discussed. I noted a good likelihood this will help address the problem. Goals of post-operative recovery were discussed as well. We will work to minimize complications. An educational handout was given as well. Questions were answered. The patient expresses understanding & wishes to proceed with surgery.

## 2014-05-23 NOTE — Interval H&P Note (Signed)
History and Physical Interval Note:  05/23/2014 5:34 PM  [name]  has presented today for surgery, with the diagnosis of Rectal Carcinoid  The various methods of treatment have been discussed with the patient and family. After consideration of risks, benefits and other options for treatment, the patient has consented to  Procedure(s): TEM PARTIAL PROCTECTOMY OF RECTAL MASS (N/A) as a surgical intervention .  The patient's history has been reviewed, patient examined, no change in status, stable for surgery.  I have reviewed the patient's chart and labs.  Questions were answered to the patient's satisfaction.     Kelli Pointer C.

## 2014-05-23 NOTE — Op Note (Signed)
05/23/2014  9:18 PM  PATIENT:  Kelli Lewis  34 y.o. female  Patient Care Team: Lenard Simmer, MD as PCP - General (Radiology) Michael Boston, MD as Consulting Physician (General Surgery) Jerene Bears, MD as Consulting Physician (Gastroenterology)  PRE-OPERATIVE DIAGNOSIS:  Rectal Carcinoid s/p partial excision  POST-OPERATIVE DIAGNOSIS:  Rectal Carcinoid s/p partial excision  PROCEDURE:    TEM PARTIAL PROCTECTOMY OF RECTAL MASS  SURGEON:  Surgeon(s): Michael Boston, MD  ASSISTANT:  RN   ANESTHESIA:   local and general  EBL:  Total I/O In: -  Out: 150 [Urine:100; Blood:50]  Delay start of Pharmacological VTE agent (>24hrs) due to surgical blood loss or risk of bleeding:  no  DRAINS: none   SPECIMEN:  Source of Specimen:  1.  Anterior rectal wall   2.  "Deep" (near rectovaginal wall) margin   DISPOSITION OF SPECIMEN:  PATHOLOGY  COUNTS:  YES  PLAN OF CARE: Admit for overnight observation  PATIENT DISPOSITION:  PACU - hemodynamically stable.  INDICATION: Patient with carcinoid in rectum s/p polypectomy with disease in scar.  Request for further excision made:  The anatomy & physiology of the digestive tract was discussed.  The pathophysiology of the rectal pathology was discussed.  Natural history risks without surgery was discussed.   I feel the risks of no intervention will lead to serious problems that outweigh the operative risks; therefore, I recommended surgery.    Laparoscopic & open abdominal techniques were discussed.  I recommended we start with a partial proctectomy by transanal endoscopic microsurgery (TEM) for excisional biopsy to remove the pathology and hopefully cure and/or control the pathology.  This technique can offer less operative risk and faster post-operative recovery.  Possible need for immediate or later abdominal surgery for further treatment was discussed.   Risks such as bleeding, abscess, reoperation, ostomy, heart attack, death, and other  risks were discussed.   I noted a good likelihood this will help address the problem.  Goals of post-operative recovery were discussed as well.  We will work to minimize complications.  An educational handout was given as well.  Questions were answered.  The patient expresses understanding & wishes to proceed with surgery.  OR FINDINGS: Scar right anterior rectum 6-7cm from anal verge  The resulting mass was 3x4 cm cm in size.  Extra distal margin 5x1cm in size (similar pins)  Pin placement on pathology specimen: Proximal margin: pink Distal margin: blue Right lateral margin: yellow Left lateral margin: white  Extra fat laying on RV septum c/w "deep" margin excised  The closure rests 4-5 cm from the anal verge in the lateral- anterior-lateral location (~60% of circumference)  DESCRIPTION: Informed consent was confirmed.  Patient received general anesthesia without difficulty.  Foley catheter sterilely placed.  Sequential compression devices active during the entire case.  The patient was placed in the prone position, taking extra care to secure and protect the patient appropriately.  The perineum and perianal regions were prepped and draped in a sterile fashion.  Surgical timeout confirmed our plan.  I did a gentle digital rectal examination with gradual anal dilation to allow placement of the 4 cm TEO Stortz scope.  This was secured to the bed using the TEO clamping system.  We induced carbon dioxide insufflation intraluminally.  I oriented the Stortz scope and the patient such that the specimen rested towards the floor at the 6:00 position.  DESCRIPTION: Informed consent was confirmed.  Patient received general anesthesia without difficulty.  Foley  catheter sterilely placed.  Sequential compression devices active during the entire case.  The patient was placed in the prone position, taking extra care to secure and protect the patient appropriately.  The perineum and perianal regions were  prepped and draped in a sterile fashion.  Surgical timeout confirmed our plan.  I did a gentle digital rectal examination with gradual anal dilation to allow placement of the 4 cm TEO Stortz scope.  This was secured to the bed using the TEO clamping system.  We induced carbon dioxide insufflation intraluminally.  I oriented the Stortz scope and the patient such that the specimen rested towards the floor at the 6:00 position.  I could easily identify the mass.   Stellate pinpoint scar with 2x2cm swelling.  I went ahead and used tip point cautery to mark 1 cm margins circumferentially.  I then did a full thickness transection at the distal margin.  I came around laterally.  Switched over to harmonic dissection.  Lifted the specimen off the rectovaginal septum for a good deep margin.  I gradually came more proximally.  Transected at the proximal margin.  I ensured hemostasis.  I removed off some scar & fat just superficial to the rectovaginal septum.  I excised an extra 1cm distal margin from right lateral to left lateral.  I inspected the main specimen and pinned it on thick cork board.  Pins as noted above.  I walked the specimen down to pathology.  Because it was 8:15 pm, no patholoigst or tech was available.  I did d/w the pathology on call on there phone whom noted the tech would be in the morning to d/w me to verify proper orientation.  The PA/tech called & said he would be available in the AM to clarify orientation if needed    I went back in and scrubbed in.  Hemostasis excellent.  I reapproximated the wound with a 2-0 V-lock horizontal mattress stitch to bring the middle part of the rectum down to help close the middle of the wound, running that to the left lateral corner.  I then closed the right side of the wound using a 2-0 V-lock serrated stitch in a running fashion from right lateral to center/anterior.  This brought things together well.  I did meticulous inspection with fine tip instruments to  confirm good watertight closure   Hemostasis excellent.  The lumen was quite patent.  Carbon dioxide evacuated & instruments removed.  The patient is being extubated go to recovery room.  I am about to discuss operative findings with the patient's family.  Adin Hector, M.D., F.A.C.S. Gastrointestinal and Minimally Invasive Surgery [facility] [address], [place], [place] [address] [phone] Main / Paging

## 2014-05-23 NOTE — Discharge Instructions (Signed)
ANORECTAL SURGERY:  POST OPERATIVE INSTRUCTIONS  1. Take your usually prescribed home medications unless otherwise directed. 2. DIET: Follow a light bland diet the first 24 hours after arrival home, such as soup, liquids, crackers, etc.  Be sure to include lots of fluids daily.  Avoid fast food or heavy meals as your are more likely to get nauseated.  Eat a low fat the next few days after surgery.   3. PAIN CONTROL: a. Pain is best controlled by a usual combination of three different methods TOGETHER: i. Ice/Heat ii. Over the counter pain medication iii. Prescription pain medication b. Most patients will experience some swelling and discomfort in the anus/rectal area. and incisions.  Ice packs or heat (30-60 minutes up to 6 times a day) will help. Use ice for the first few days to help decrease swelling and bruising, then switch to heat such as warm towels, sitz baths, warm baths, etc to help relax tight/sore spots and speed recovery.  Some people prefer to use ice alone, heat alone, alternating between ice & heat.  Experiment to what works for you.  Swelling and bruising can take several weeks to resolve.   c. It is helpful to take an over-the-counter pain medication regularly for the first few weeks.  Choose one of the following that works best for you: i. Naproxen (Aleve, etc)  Two 261m tabs twice a day ii. Ibuprofen (Advil, etc) Three 2015mtabs four times a day (every meal & bedtime) iii. Acetaminophen (Tylenol, etc) 500-65049mour times a day (every meal & bedtime) d. A  prescription for pain medication (such as oxycodone, hydrocodone, etc) should be given to you upon discharge.  Take your pain medication as prescribed.  i. If you are having problems/concerns with the prescription medicine (does not control pain, nausea, vomiting, rash, itching, etc), please call us [phone] see if we need to switch you to a different pain medicine that will work better for you and/or control your  side effect better. ii. If you need a refill on your pain medication, please contact your pharmacy.  They will contact our office to request authorization. Prescriptions will not be filled after 5 pm or on week-ends.  Use a Sitz Bath 4-8 times a day for relief A sitz bath is a warm water bath taken in the sitting position that covers only the hips and buttocks. It may be used for either healing or hygiene purposes. Sitz baths are also used to relieve pain, itching, or muscle spasms. The water may contain medicine. Moist heat will help you heal and relax.  HOME CARE INSTRUCTIONS  Take 3 to 4 sitz baths a day. 1. Fill the bathtub half full with warm water. 2. Sit in the water and open the drain a little. 3. Turn on the warm water to keep the tub half full. Keep the water running constantly. 4. Soak in the water for 15 to 20 minutes. 5. After the sitz bath, pat the affected area dry first. SEEK MEDICAL CARE IF:  You get worse instead of better. Stop the sitz baths if you get worse.   4. KEEP YOUR BOWELS REGULAR a. The goal is one bowel movement a day b. Avoid getting constipated.  Between the surgery and the pain medications, it is common to experience some constipation.  Increasing fluid intake and taking a fiber supplement (such as Metamucil, Citrucel, FiberCon, MiraLax, etc) 1-2 times a day regularly will usually help prevent this problem from occurring.  A  mild laxative (prune juice, Milk of Magnesia, MiraLax, etc) should be taken according to package directions if there are no bowel movements after 48 hours. c. Watch out for diarrhea.  If you have many loose bowel movements, simplify your diet to bland foods & liquids for a few days.  Stop any stool softeners and decrease your fiber supplement.  Switching to mild anti-diarrheal medications (Kayopectate, Pepto Bismol) can help.  If this worsens or does not improve, please call us.  5. Wound Care a. Remove your bandages the day after surgery.   Unless discharge instructions indicate otherwise, leave your bandage dry and in place overnight.  Remove the bandage during your first bowel movement.   b. Allow the wound packing to fall out over the next few days.  You can trim exposed gauze / ribbon as it falls out.  You do not need to repack the wound unless instructed otherwise.  Wear an absorbent pad or soft cotton gauze in your underwear as needed to catch any drainage and help keep the area  c. Keep the area clean and dry.  Bathe / shower every day.  Keep the area clean by showering / bathing over the incision / wound.   It is okay to soak an open wound to help wash it.  Wet wipes or showers / gentle washing after bowel movements is often less traumatic than regular toilet paper. d. Dennis Bast may have some styrofoam-like soft packing in the rectum which will come out with the first bowel movement.  e. You will often notice bleeding with bowel movements.  This should slow down by the end of the first week of surgery f. Expect some drainage.  This should slow down, too, by the end of the first week of surgery.  Wear an absorbent pad or soft cotton gauze in your underwear until the drainage stops. 6. ACTIVITIES as tolerated:   a. You may resume regular (light) daily activities beginning the next day--such as daily self-care, walking, climbing stairs--gradually increasing activities as tolerated.  If you can walk 30 minutes without difficulty, it is safe to try more intense activity such as jogging, treadmill, bicycling, low-impact aerobics, swimming, etc. b. Save the most intensive and strenuous activity for last such as sit-ups, heavy lifting, contact sports, etc  Refrain from any heavy lifting or straining until you are off narcotics for pain control.   c. DO NOT PUSH THROUGH PAIN.  Let pain be your guide: If it hurts to do something, don't do it.  Pain is your body warning you to avoid that activity for another week until the pain goes down. d. You may  drive when you are no longer taking prescription pain medication, you can comfortably sit for long periods of time, and you can safely maneuver your car and apply brakes. e. Dennis Bast may have sexual intercourse when it is comfortable.  7. FOLLOW UP in our office a. Please call [facility] at [phone] to set up an appointment to see your surgeon in the office for a follow-up appointment approximately 2 weeks after your surgery. b. Make sure that you call for this appointment the day you arrive home to insure a convenient appointment time. 10. IF YOU HAVE DISABILITY OR FAMILY LEAVE FORMS, BRING THEM TO THE OFFICE FOR PROCESSING.  DO NOT GIVE THEM TO YOUR DOCTOR.        WHEN TO CALL us [phone]: 1. Poor pain control 2. Reactions / problems with new medications (rash/itching, nausea, etc)  3. Fever over 101.5 F (38.5 C) 4. Inability to urinate 5. Nausea and/or vomiting 6. Worsening swelling or bruising 7. Continued bleeding from incision. 8. Increased pain, redness, or drainage from the incision  The clinic staff is available to answer your questions during regular business hours (8:30am-5pm).  Please dont hesitate to call and ask to speak to one of our nurses for clinical concerns.   A surgeon from [facility] is always on call at the hospitals   If you have a medical emergency, go to the nearest emergency room or call 911.    [facility], [address], [place], [place], [place]  [address] ? MAIN: [phone] ? TOLL FREE: [phone] ? FAX [phone] [email or web address]  GETTING TO GOOD BOWEL HEALTH. Irregular bowel habits such as constipation and diarrhea can lead to many problems over time.  Having one soft bowel movement a day is the most important way to prevent further problems.  The anorectal canal is designed to handle stretching and feces to safely manage our ability to get rid of solid waste (feces, poop, stool) out of  our body.  BUT, hard constipated stools can act like ripping concrete bricks and diarrhea can be a burning fire to this very sensitive area of our body, causing inflamed hemorrhoids, anal fissures, increasing risk is perirectal abscesses, abdominal pain/bloating, an making irritable bowel worse.     The goal: ONE SOFT BOWEL MOVEMENT A DAY!  To have soft, regular bowel movements:   Drink at least 8 tall glasses of water a day.    Take plenty of fiber.  Fiber is the undigested part of plant food that passes into the colon, acting s natures broom to encourage bowel motility and movement.  Fiber can absorb and hold large amounts of water. This results in a larger, bulkier stool, which is soft and easier to pass. Work gradually over several weeks up to 6 servings a day of fiber (25g a day even more if needed) in the form of: o Vegetables -- Root (potatoes, carrots, turnips), leafy green (lettuce, salad greens, celery, spinach), or cooked high residue (cabbage, broccoli, etc) o Fruit -- Fresh (unpeeled skin & pulp), Dried (prunes, apricots, cherries, etc ),  or stewed ( applesauce)  o Whole grain breads, pasta, etc (whole wheat)  o Bran cereals   Bulking Agents -- This type of water-retaining fiber generally is easily obtained each day by one of the following:  o Psyllium bran -- The psyllium plant is remarkable because its ground seeds can retain so much water. This product is available as Metamucil, Konsyl, Effersyllium, Per Diem Fiber, or the less expensive generic preparation in drug and health food stores. Although labeled a laxative, it really is not a laxative.  o Methylcellulose -- This is another fiber derived from wood which also retains water. It is available as Citrucel. o Polyethylene Glycol - and artificial fiber commonly called Miralax or Glycolax.  It is helpful for people with gassy or bloated feelings with regular fiber o Flax Seed - a less gassy fiber than psyllium  No reading or  other relaxing activity while on the toilet. If bowel movements take longer than 5 minutes, you are too constipated  AVOID CONSTIPATION.  High fiber and water intake usually takes care of this.  Sometimes a laxative is needed to stimulate more frequent bowel movements, but   Laxatives are not a good long-term solution as it can wear the colon out. o  Osmotics (Milk of Magnesia, Fleets phosphosoda, Magnesium citrate, MiraLax, GoLytely) are safer than  o Stimulants (Senokot, Castor Oil, Dulcolax, Ex Lax)    o Do not take laxatives for more than 7days in a row.   IF SEVERELY CONSTIPATED, try a Bowel Retraining Program: o Do not use laxatives.  o Eat a diet high in roughage, such as bran cereals and leafy vegetables.  o Drink six (6) ounces of prune or apricot juice each morning.  o Eat two (2) large servings of stewed fruit each day.  o Take one (1) heaping tablespoon of a psyllium-based bulking agent twice a day. Use sugar-free sweetener when possible to avoid excessive calories.  o Eat a normal breakfast.  o Set aside 15 minutes after breakfast to sit on the toilet, but do not strain to have a bowel movement.  o If you do not have a bowel movement by the third day, use an enema and repeat the above steps.   Controlling diarrhea o Switch to liquids and simpler foods for a few days to avoid stressing your intestines further. o Avoid dairy products (especially milk & ice cream) for a short time.  The intestines often can lose the ability to digest lactose when stressed. o Avoid foods that cause gassiness or bloating.  Typical foods include beans and other legumes, cabbage, broccoli, and dairy foods.  Every person has some sensitivity to other foods, so listen to our body and avoid those foods that trigger problems for you. o Adding fiber (Citrucel, Metamucil, psyllium, Miralax) gradually can help thicken stools by absorbing excess fluid and retrain the intestines to act more normally.  Slowly  increase the dose over a few weeks.  Too much fiber too soon can backfire and cause cramping & bloating. o Probiotics (such as active yogurt, Align, etc) may help repopulate the intestines and colon with normal bacteria and calm down a sensitive digestive tract.  Most studies show it to be of mild help, though, and such products can be costly. o Medicines: - Bismuth subsalicylate (ex. Kayopectate, Pepto Bismol) every 30 minutes for up to 6 doses can help control diarrhea.  Avoid if pregnant. - Loperamide (Immodium) can slow down diarrhea.  Start with two tablets (4mg  total) first and then try one tablet every 6 hours.  Avoid if you are having fevers or severe pain.  If you are not better or start feeling worse, stop all medicines and call your doctor for advice o Call your doctor if you are getting worse or not better.  Sometimes further testing (cultures, endoscopy, X-ray studies, bloodwork, etc) may be needed to help diagnose and treat the cause of the diarrhea.  Managing Pain  Pain after surgery or related to activity is often due to strain/injury to muscle, tendon, nerves and/or incisions.  This pain is usually short-term and will improve in a few months.   Many people find it helpful to do the following things TOGETHER to help speed the process of healing and to get back to regular activity more quickly:  1. Avoid heavy physical activity a.  no lifting greater than 20 pounds b. Do not push through the pain.  Listen to your body and avoid positions and maneuvers than reproduce the pain c. Walking is okay as tolerated, but go slowly and stop when getting sore.  d. Remember: If it hurts to do it, then dont do it! 2. Take Anti-inflammatory medication  a. Take with food/snack around the clock for 1-2 weeks i.  This helps the muscle and nerve tissues become less irritable and calm down faster b. Choose ONE of the following over-the-counter medications: i. Naproxen 220mg  tabs (ex. Aleve) 1-2  pills twice a day  ii. Ibuprofen 200mg  tabs (ex. Advil, Motrin) 3-4 pills with every meal and just before bedtime iii. Acetaminophen 500mg  tabs (Tylenol) 1-2 pills with every meal and just before bedtime 3. Use a Heating pad or Ice/Cold Pack a. 4-6 times a day b. May use warm bath/hottub  or showers 4. Try Gentle Massage and/or Stretching  a. at the area of pain many times a day b. stop if you feel pain - do not overdo it  Try these steps together to help you body heal faster and avoid making things get worse.  Doing just one of these things may not be enough.    If you are not getting better after two weeks or are noticing you are getting worse, contact our office for further advice; we may need to re-evaluate you & see what other things we can do to help.

## 2014-05-23 NOTE — Transfer of Care (Signed)
Immediate Anesthesia Transfer of Care Note  Patient: Kelli Lewis  Procedure(s) Performed: Procedure(s): TEM PARTIAL PROCTECTOMY OF RECTAL MASS (N/A)  Patient Location: PACU  Anesthesia Type:General  Level of Consciousness: awake  Airway & Oxygen Therapy: Patient Spontanous Breathing and Patient connected to face mask oxygen  Post-op Assessment: Report given to RN and Post -op Vital signs reviewed and stable  Post vital signs: Reviewed and stable  Last Vitals:  Filed Vitals:   05/23/14 1306  BP: 135/90  Pulse: 68  Temp: 36.9 C  Resp: 16    Complications: No apparent anesthesia complications

## 2014-05-23 NOTE — Anesthesia Procedure Notes (Signed)
Procedure Name: Intubation Date/Time: 05/23/2014 6:49 PM Performed by: Danley Danker L Patient Re-evaluated:Patient Re-evaluated prior to inductionOxygen Delivery Method: Circle system utilized Preoxygenation: Pre-oxygenation with 100% oxygen Intubation Type: IV induction Ventilation: Mask ventilation without difficulty and Oral airway inserted - appropriate to patient size Laryngoscope Size: Sabra Heck and 2 Grade View: Grade I Tube type: Oral Tube size: 7.5 mm Number of attempts: 1 Airway Equipment and Method: Stylet Placement Confirmation: ETT inserted through vocal cords under direct vision,  breath sounds checked- equal and bilateral and positive ETCO2 Secured at: 21 cm Tube secured with: Tape Dental Injury: Teeth and Oropharynx as per pre-operative assessment

## 2014-05-23 NOTE — Anesthesia Preprocedure Evaluation (Addendum)
Anesthesia Evaluation  Patient identified by MRN, date of birth, ID band Patient awake    Reviewed: Allergy & Precautions, NPO status , Patient's Chart, lab work & pertinent test results  Airway Mallampati: III  TM Distance: >3 FB Neck ROM: Full    Dental  (+) Teeth Intact   Pulmonary asthma ,          Cardiovascular negative cardio ROS      Neuro/Psych  Headaches,    GI/Hepatic Neg liver ROS, GERD-  ,Rectal carcinoid Crohns dz   Endo/Other  negative endocrine ROS  Renal/GU negative Renal ROS     Musculoskeletal negative musculoskeletal ROS (+)   Abdominal   Peds  Hematology negative hematology ROS (+)   Anesthesia Other Findings   Reproductive/Obstetrics                            Anesthesia Physical Anesthesia Plan  ASA: II  Anesthesia Plan: General   Post-op Pain Management:    Induction: Intravenous  Airway Management Planned: Oral ETT  Additional Equipment:   Intra-op Plan:   Post-operative Plan: Extubation in OR  Informed Consent: I have reviewed the patients History and Physical, chart, labs and discussed the procedure including the risks, benefits and alternatives for the proposed anesthesia with the patient or authorized representative who has indicated his/her understanding and acceptance.   Dental advisory given  Plan Discussed with: CRNA  Anesthesia Plan Comments:         Anesthesia Quick Evaluation

## 2014-05-24 ENCOUNTER — Encounter (HOSPITAL_COMMUNITY): Payer: Self-pay | Admitting: *Deleted

## 2014-05-24 DIAGNOSIS — C7A026 Malignant carcinoid tumor of the rectum: Secondary | ICD-10-CM | POA: Diagnosis not present

## 2014-05-24 MED ORDER — SODIUM CHLORIDE 0.9 % IV SOLN: 250.0000 mL | INTRAVENOUS | Status: DC | PRN

## 2014-05-24 MED ORDER — SODIUM CHLORIDE 0.9 % IJ SOLN: 3.0000 mL | INTRAMUSCULAR | Status: DC | PRN

## 2014-05-24 MED ORDER — SODIUM CHLORIDE 0.9 % IJ SOLN: 3.0000 mL | Freq: Two times a day (BID) | INTRAMUSCULAR | Status: DC

## 2014-05-24 NOTE — Discharge Summary (Signed)
Physician Discharge Summary  Patient ID: Kelli Lewis MRN: [id number] DOB/AGE: 35/19/1982 35 y.o.  Admit date: 05/23/2014 Discharge date: 05/24/2014  Patient Care Team: Lenard Simmer, MD as PCP - General (Radiology) Michael Boston, MD as Consulting Physician (General Surgery) Jerene Bears, MD as Consulting Physician (Gastroenterology)  Admission Diagnoses: Principal Problem:   Rectal carcinoid tumor Active Problems:   IBD (inflammatory bowel disease)   Discharge Diagnoses:  Principal Problem:   Rectal carcinoid tumor Active Problems:   IBD (inflammatory bowel disease)   PRE-OPERATIVE DIAGNOSIS: Rectal Carcinoid s/p partial excision  POST-OPERATIVE DIAGNOSIS: Rectal Carcinoid s/p partial excision  PROCEDURE:   TEM PARTIAL PROCTECTOMY OF RECTAL MASS  SURGEON: Surgeon(s): Michael Boston, MD  Consults: None  Hospital Course:   The patient underwent the surgery above.  Postoperatively, the patient gradually mobilized and advanced to a solid diet.  Pain and other symptoms were treated aggressively.    By the time of discharge, the patient was walking well the hallways, eating food, having flatus.  Pain was well-controlled on an oral medications.  Based on meeting discharge criteria and continuing to recover, I felt it was safe for the patient to be discharged from the hospital to further recover with close followup. Postoperative recommendations were discussed in detail.  They are written as well.   Significant Diagnostic Studies:  Results for orders placed or performed during the hospital encounter of 05/21/14 (from the past 72 hour(s))  CBC     Status: None   Collection Time: 05/21/14  9:00 AM  Result Value Ref Range   WBC 5.5 4.0 - 10.5 K/uL   RBC 4.81 3.87 - 5.11 MIL/uL   Hemoglobin 13.4 12.0 - 15.0 g/dL   HCT 39.9 36.0 - 46.0 %   MCV 83.0 78.0 - 100.0 fL   MCH 27.9 26.0 - 34.0 pg   MCHC 33.6 30.0 - 36.0 g/dL   RDW 12.7 11.5 - 15.5 %   Platelets 276 150 -  400 K/uL  Basic metabolic panel     Status: Abnormal   Collection Time: 05/21/14  9:00 AM  Result Value Ref Range   Sodium 141 135 - 145 mmol/L   Potassium 4.1 3.5 - 5.1 mmol/L   Chloride 107 96 - 112 mmol/L   CO2 28 19 - 32 mmol/L   Glucose, Bld 100 (H) 70 - 99 mg/dL   BUN 13 6 - 23 mg/dL   Creatinine, Ser 0.65 0.50 - 1.10 mg/dL   Calcium 8.9 8.4 - 10.5 mg/dL   GFR calc non Af Amer >90 >90 mL/min   GFR calc Af Amer >90 >90 mL/min    Comment: (NOTE) The eGFR has been calculated using the CKD EPI equation. This calculation has not been validated in all clinical situations. eGFR's persistently <90 mL/min signify possible Chronic Kidney Disease.    Anion gap 6 5 - 15  Type and screen     Status: None   Collection Time: 05/21/14  9:00 AM  Result Value Ref Range   ABO/RH(D) A POS    Antibody Screen NEG    Sample Expiration 05/26/2014   hCG, serum, qualitative     Status: None   Collection Time: 05/21/14  9:00 AM  Result Value Ref Range   Preg, Serum NEGATIVE NEGATIVE    Comment:        THE SENSITIVITY OF THIS METHODOLOGY IS >10 mIU/mL.   ABO/Rh     Status: None   Collection Time: 05/21/14  9:00 AM  Result Value Ref Range   ABO/RH(D) A POS     No results found.  Discharge Exam: Blood pressure 104/66, pulse 96, temperature 98.3 F (36.8 C), temperature source Oral, resp. rate 18, height _0  (1.626 m), weight 148 lb (67.132 kg), SpO2 97 %.  General: Pt awake/alert/oriented x4 in no major acute distress Eyes: PERRL, normal EOM. Sclera nonicteric Neuro: CN II-XII intact w/o focal sensory/motor deficits. Lymph: No head/neck/groin lymphadenopathy Psych:  No delerium/psychosis/paranoia HENT: Normocephalic, Mucus membranes moist.  No thrush Neck: Supple, No tracheal deviation Chest: No pain.  Good respiratory excursion. CV:  Pulses intact.  Regular rhythm MS: Normal AROM mjr joints.  No obvious deformity Abdomen: Soft, Nondistended.  Nontender.  No incarcerated  hernias. Ext:  SCDs BLE.  No significant edema.  No cyanosis Skin: No petechiae / purpura  Discharged Condition: good   Past Medical History  Diagnosis Date  . Duodenal diverticulum   . Herpes   . Family history of malignant neoplasm of gastrointestinal tract   . Crohn's disease   . Colon polyps   . Allergy   . Anxiety   . Heart murmur   . GERD (gastroesophageal reflux disease)     hx of with pregnancy  . Headache     occas migraines  . 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 Asthma     seasonal allergy induced does not use inhalers  . Anal fissure 01/04/2011    Past Surgical History  Procedure Laterality Date  . Melanoma excision    . Eus N/A 05/02/2014    Procedure: LOWER ENDOSCOPIC ULTRASOUND (EUS);  Surgeon: Milus Banister, MD;  Location: [facility];  Service: Endoscopy;  Laterality: N/A;  . Wisdom tooth extraction      History   Social History  . Marital Status: Single    Spouse Name: N/A    Number of Children: 1  . Years of Education: N/A   Occupational History  . CPA    Social History Main Topics  . Smoking status: Never Smoker   . Smokeless tobacco: Never Used  . Alcohol Use: Yes     Comment: once monthly - beer  . Drug Use: No  . Sexual Activity: Yes    Birth Control/ Protection: IUD   Other Topics Concern  . Not on file   Social History Narrative    Family History  Problem Relation Age of Onset  . Colon cancer Paternal Grandmother   . Breast cancer Paternal Grandmother   . Crohn's disease Paternal Grandmother   . Parkinsonism Father   . Colon polyps Father   . Irritable bowel syndrome Mother   . Esophageal cancer Neg Hx   . Stomach cancer Neg Hx   . Prostate cancer Neg Hx     Current Facility-Administered Medications  Medication Dose Route Frequency Provider Last Rate Last Dose  . 0.9 %  sodium chloride infusion  250 mL Intravenous PRN Michael Boston, MD      . 0.9 %  sodium chloride infusion  250 mL Intravenous PRN  Michael Boston, MD      . acetaminophen (TYLENOL) tablet 1,000 mg  1,000 mg Oral TID Michael Boston, MD   1,000 mg at 05/23/14 2300  . ALPRAZolam Duanne Moron) tablet 0.25 mg  0.25 mg Oral QHS Michael Boston, MD   0.25 mg at 05/23/14 2300  . alum & mag hydroxide-simeth (MAALOX/MYLANTA) 200-200-20 MG/5ML suspension 30 mL  30 mL Oral Q6H PRN Michael Boston,  MD      . alvimopan (ENTEREG) capsule 12 mg  12 mg Oral BID Michael Boston, MD      . diphenhydrAMINE (BENADRYL) 12.5 MG/5ML elixir 12.5 mg  12.5 mg Oral Q6H PRN Michael Boston, MD       Or  . diphenhydrAMINE (BENADRYL) injection 12.5 mg  12.5 mg Intravenous Q6H PRN Michael Boston, MD      . heparin injection 5,000 Units  5,000 Units Subcutaneous 3 times per day Michael Boston, MD      . HYDROmorphone (DILAUDID) injection 0.5-2 mg  0.5-2 mg Intravenous Q1H PRN Michael Boston, MD      . lactated ringers bolus 1,000 mL  1,000 mL Intravenous Q8H PRN Michael Boston, MD      . lip balm (CARMEX) ointment 1 application  1 application Topical BID Michael Boston, MD   1 application at 07/37/10 2300  . magic mouthwash  15 mL Oral QID PRN Michael Boston, MD      . menthol-cetylpyridinium (CEPACOL) lozenge 3 mg  1 lozenge Oral PRN Michael Boston, MD      . mesalamine (LIALDA) EC tablet 4.8 g  4.8 g Oral QPM Michael Boston, MD      . metoprolol (LOPRESSOR) injection 5 mg  5 mg Intravenous Q6H PRN Michael Boston, MD      . metoprolol tartrate (LOPRESSOR) tablet 12.5 mg  12.5 mg Oral Q12H PRN Michael Boston, MD      . montelukast (SINGULAIR) tablet 10 mg  10 mg Oral Daily PRN Michael Boston, MD      . ondansetron [facility]) tablet 4 mg  4 mg Oral Q6H PRN Michael Boston, MD       Or  . ondansetron [facility]) injection 4 mg  4 mg Intravenous Q6H PRN Michael Boston, MD      . oxyCODONE (Oxy IR/ROXICODONE) immediate release tablet 5-10 mg  5-10 mg Oral Q4H PRN Michael Boston, MD   5 mg at 05/24/14 0000  . phenol (CHLORASEPTIC) mouth spray 2 spray  2 spray Mouth/Throat PRN Michael Boston, MD      . promethazine  (PHENERGAN) 25 MG/ML injection           . promethazine (PHENERGAN) injection 6.25-12.5 mg  6.25-12.5 mg Intravenous Q4H PRN Michael Boston, MD      . saccharomyces boulardii (FLORASTOR) capsule 250 mg  250 mg Oral BID Michael Boston, MD   250 mg at 05/23/14 2351  . sodium chloride 0.9 % injection 3 mL  3 mL Intravenous Q12H Michael Boston, MD   3 mL at 05/23/14 2351  . sodium chloride 0.9 % injection 3 mL  3 mL Intravenous PRN Michael Boston, MD      . sodium chloride 0.9 % injection 3 mL  3 mL Intravenous Q12H Michael Boston, MD      . sodium chloride 0.9 % injection 3 mL  3 mL Intravenous PRN Michael Boston, MD      . sodium chloride 0.9 % injection              No Known Allergies  Disposition: 01-Home or Self Care  Discharge Instructions    Call MD for:  extreme fatigue    Complete by:  As directed      Call MD for:  extreme fatigue    Complete by:  As directed      Call MD for:  hives    Complete by:  As directed      Call MD for:  hives    Complete by:  As directed      Call MD for:  persistant nausea and vomiting    Complete by:  As directed      Call MD for:  persistant nausea and vomiting    Complete by:  As directed      Call MD for:  redness, tenderness, or signs of infection (pain, swelling, redness, odor or green/yellow discharge around incision site)    Complete by:  As directed      Call MD for:  redness, tenderness, or signs of infection (pain, swelling, redness, odor or green/yellow discharge around incision site)    Complete by:  As directed      Call MD for:  severe uncontrolled pain    Complete by:  As directed      Call MD for:  severe uncontrolled pain    Complete by:  As directed      Call MD for:    Complete by:  As directed   Temperature > 101.80F     Call MD for:    Complete by:  As directed   Temperature > 101.80F     Diet - low sodium heart healthy    Complete by:  As directed      Discharge instructions    Complete by:  As directed   Please see discharge  instruction sheets.  Also refer to handout given an office.  Please call our office if you have any questions or concerns [phone]     Discharge instructions    Complete by:  As directed   Please see discharge instruction sheets.  Also refer to handout given an office.  Please call our office if you have any questions or concerns [phone]     Discharge wound care:    Complete by:  As directed   If you have closed incisions, shower and bathe over these incisions with soap and water every day.  Remove all surgical dressings on postoperative day #3.  You do not need to replace dressings over the closed incisions unless you feel more comfortable with a Band-Aid covering it.   If you have an open wound that requires packing, please see wound care instructions.  In general, remove all dressings, wash wound with soap and water and then replace with saline moistened gauze.  Do the dressing change at least every day.  Please call our office [phone] if you have further questions.     Discharge wound care:    Complete by:  As directed   If you have closed incisions, shower and bathe over these incisions with soap and water every day.  Remove all surgical dressings on postoperative day #3.  You do not need to replace dressings over the closed incisions unless you feel more comfortable with a Band-Aid covering it.   If you have an open wound that requires packing, please see wound care instructions.  In general, remove all dressings, wash wound with soap and water and then replace with saline moistened gauze.  Do the dressing change at least every day.  Please call our office [phone] if you have further questions.     Driving Restrictions    Complete by:  As directed   No driving until off narcotics and can safely swerve away without pain during an emergency     Driving Restrictions    Complete by:  As directed   No driving until off narcotics and can safely swerve  away without pain  during an emergency     Increase activity slowly    Complete by:  As directed   Walk an hour a day.  Use 20-30 minute walks.  When you can walk 30 minutes without difficulty, increase to low impact/moderate activities such as biking, jogging, swimming, sexual activity..  Eventually can increase to unrestricted activity when not feeling pain.  If you feel pain: STOP!Marland Kitchen   Let pain protect you from overdoing it.  Use ice/heat/over-the-counter pain medications to help minimize his soreness.  Use pain prescriptions as needed to remain active.  It is better to take extra pain medications and be more active than to stay bedridden to avoid all pain medications.     Increase activity slowly    Complete by:  As directed   Walk an hour a day.  Use 20-30 minute walks.  When you can walk 30 minutes without difficulty, increase to low impact/moderate activities such as biking, jogging, swimming, sexual activity..  Eventually can increase to unrestricted activity when not feeling pain.  If you feel pain: STOP!Marland Kitchen   Let pain protect you from overdoing it.  Use ice/heat/over-the-counter pain medications to help minimize his soreness.  Use pain prescriptions as needed to remain active.  It is better to take extra pain medications and be more active than to stay bedridden to avoid all pain medications.     Lifting restrictions    Complete by:  As directed   Avoid heavy lifting initially.  Do not push through pain.  You have no specific weight limit.  Coughing and sneezing or four more stressful to your incision than any lifting you will do. Pain will protect you from injury.  Therefore, avoid intense activity until off all narcotic pain medications.  Coughing and sneezing or four more stressful to your incision than any lifting he will do.     Lifting restrictions    Complete by:  As directed   Avoid heavy lifting initially.  Do not push through pain.  You have no specific weight limit.  Coughing and sneezing or four more  stressful to your incision than any lifting you will do. Pain will protect you from injury.  Therefore, avoid intense activity until off all narcotic pain medications.  Coughing and sneezing or four more stressful to your incision than any lifting he will do.     May shower / Bathe    Complete by:  As directed      May shower / Bathe    Complete by:  As directed      May walk up steps    Complete by:  As directed      May walk up steps    Complete by:  As directed      Sexual Activity Restrictions    Complete by:  As directed   Sexual activity as tolerated.  Do not push through pain.  Pain will protect you from injury.     Sexual Activity Restrictions    Complete by:  As directed   Sexual activity as tolerated.  Do not push through pain.  Pain will protect you from injury.     Walk with assistance    Complete by:  As directed   Walk over an hour a day.  May use a walker/cane/companion to help with balance and stamina.     Walk with assistance    Complete by:  As directed   Walk over an hour a day.  May use a walker/cane/companion to help with balance and stamina.            Medication List    TAKE these medications        ALPRAZolam 0.25 MG tablet  Commonly known as:  XANAX  Take 0.25 mg by mouth at bedtime.     cholecalciferol 1000 UNITS tablet  Commonly known as:  VITAMIN D  Take 1,000 Units by mouth at bedtime.     levonorgestrel 20 MCG/24HR IUD  Commonly known as:  MIRENA  1 each by Intrauterine route once.     mesalamine 1.2 G EC tablet  Commonly known as:  LIALDA  Take 4 tablets (4.8 g total) by mouth daily with breakfast.     montelukast 10 MG tablet  Commonly known as:  SINGULAIR  Take 10 mg by mouth daily as needed (asthma). In the am.     oxyCODONE 5 MG immediate release tablet  Commonly known as:  Oxy IR/ROXICODONE  Take 1-2 tablets (5-10 mg total) by mouth every 6 (six) hours as needed for moderate pain, severe pain or breakthrough pain.     VITAMIN  B 12 PO  Take 1 tablet by mouth at bedtime.     Vitamin D (Ergocalciferol) 50000 UNITS Caps capsule  Commonly known as:  DRISDOL  Take 1 capsule (50,000 Units total) by mouth every 7 (seven) days.           Follow-up Information    Follow up with Tylie Golonka C., MD In 3 weeks.   Specialty:  General Surgery   Why:  To follow up after your operation, To follow up after your hospital stay   Contact information:   [address] [place] [address] [phone]        Signed: Morton Peters, M.D., F.A.C.S. Gastrointestinal and Minimally Invasive Surgery [facility] [address] [place], [place] [address] [phone] Main / Paging   05/24/2014, 8:01 AM

## 2014-05-27 NOTE — Anesthesia Postprocedure Evaluation (Signed)
  Anesthesia Post-op Note  Patient: Kelli Lewis  Procedure(s) Performed: Procedure(s): TEM PARTIAL PROCTECTOMY OF RECTAL MASS (N/A)  Patient Location: PACU  Anesthesia Type:General  Level of Consciousness: awake and alert   Airway and Oxygen Therapy: Patient Spontanous Breathing  Post-op Pain: none  Post-op Assessment: Post-op Vital signs reviewed  Post-op Vital Signs: Reviewed  Last Vitals:  Filed Vitals:   05/24/14 0543  BP: 104/66  Pulse: 96  Temp: 36.8 C  Resp: 18    Complications: No apparent anesthesia complications

## 2014-05-28 ENCOUNTER — Telehealth: Payer: Self-pay

## 2014-05-28 NOTE — Telephone Encounter (Signed)
Per OV note pt was to have OV in 6 mth which would be in April. OV scheduled and appt letter mailed to pt.

## 2014-05-28 NOTE — Telephone Encounter (Signed)
-----   Message from Jerene Bears, MD sent at 05/28/2014  9:37 AM EST ----- Kelli Lewis is my patient, hx of rectal carcinoid.  Now s/p resection Has IBD, needs followup per last OV with me Thanks Ulice Dash  ----- Message -----    From: Michael Boston, MD    Sent: 05/28/2014   7:27 AM      To: Illene Regulus, Jerene Bears, MD  Pathology shows benign results: Old scar in rectal wall resection with no more carcinoid remaining.  Small = very early = good prognosis    Alisha, CCS MA, please call on the patient to make sure recovery is going well & tell pt the good news on the pathology.  Thanks,  Adin Hector, M.D., F.A.C.S. Gastrointestinal and Minimally Invasive Surgery [facility] [address] [place], [place] [address] [phone] Main / Paging

## 2014-07-24 ENCOUNTER — Encounter: Payer: Self-pay | Admitting: *Deleted

## 2014-08-16 ENCOUNTER — Ambulatory Visit
Admission: RE | Admit: 2014-08-16 | Discharge: 2014-08-16 | Disposition: A | Discharge status: Home / Self Care | Payer: 59 | Source: Ambulatory Visit | Attending: Obstetrics and Gynecology | Admitting: Obstetrics and Gynecology

## 2014-08-16 ENCOUNTER — Other Ambulatory Visit: Payer: Self-pay | Admitting: Obstetrics and Gynecology

## 2014-08-16 ENCOUNTER — Encounter (INDEPENDENT_AMBULATORY_CARE_PROVIDER_SITE_OTHER): Payer: Self-pay

## 2014-08-16 DIAGNOSIS — N6459 Other signs and symptoms in breast: Secondary | ICD-10-CM

## 2014-08-19 ENCOUNTER — Ambulatory Visit: Payer: 59 | Admitting: Internal Medicine

## 2014-08-19 ENCOUNTER — Encounter: Payer: Self-pay | Admitting: Internal Medicine

## 2014-08-19 ENCOUNTER — Ambulatory Visit (INDEPENDENT_AMBULATORY_CARE_PROVIDER_SITE_OTHER): Payer: 59 | Admitting: Internal Medicine

## 2014-08-19 VITALS — BP 122/80 | HR 76 | Ht 64.0 in | Wt 144.2 lb

## 2014-08-19 DIAGNOSIS — E538 Deficiency of other specified B group vitamins: Secondary | ICD-10-CM

## 2014-08-19 DIAGNOSIS — C20 Malignant neoplasm of rectum: Secondary | ICD-10-CM | POA: Diagnosis not present

## 2014-08-19 DIAGNOSIS — E559 Vitamin D deficiency, unspecified: Secondary | ICD-10-CM

## 2014-08-19 DIAGNOSIS — K51919 Ulcerative colitis, unspecified with unspecified complications: Secondary | ICD-10-CM | POA: Diagnosis not present

## 2014-08-19 MED ORDER — MESALAMINE 1.2 G PO TBEC: 2.4000 g | DELAYED_RELEASE_TABLET | Freq: Every day | ORAL | Status: DC

## 2014-08-19 NOTE — Patient Instructions (Signed)
We have sent the following medications to your pharmacy for you to pick up at your convenience: Lialda 2.4 grams daily (decreased from 4.8 grams daily)  Please follow up with Dr Hilarie Fredrickson in 1 year. Prior to that office visit, you will need to go to the lab in the basement (CBC, CMP, B12, Vitamin D)

## 2014-08-19 NOTE — Progress Notes (Signed)
Subjective:    Patient ID: Kelli Lewis, female    DOB: May 05, 1980, 34 y.o.   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 She was last seen in October 2015 in the office at which time she was in clinical remission. Repeat colonoscopy was scheduled to evaluate for disease activity. Colonoscopy was performed on 04/10/2014. This revealed 2 small nonbleeding ulcers in the terminal ileum which were biopsied. These were isolated and otherwise without ileitis. The colonic mucosa was normal in appearance throughout the colon. There was a sessile polyp removed in the rectum with cold forceps. There was mild diverticulosis in the ascending and sigmoid colon. Rectal polyp was found to be a carcinoid tumor  (NET G1).    After carcinoid diagnosis she was scheduled for an endoscopic ultrasound with Dr. Ardis Hughs which was performed. The echo layering of the rectum was normal. Additional snare cautery polypectomy was performed at the prior scar in the rectum and revealed again low-grade neuroendocrine tumor. Due to persistent carcinoid she then had a TEM partial proctectomy of the rectal scar performed by Dr. Johney Maine.  Fortunately pathology showed ulcer and scar with no residual tumor.   She recovered well from that surgery and is feeling well. She continues on Lialda 4.8 g daily. She denies abdominal pain. No fevers or chills. No diarrhea, rectal bleeding or melena. Good appetite. No weight loss. No early satiety. No joint symptoms or hepatobiliary complaint  She was taking vitamin D and B12 but often forgets   Review of Systems  as per history of present illness, otherwise negative  Current Medications, Allergies, Past Medical History, Past Surgical History, Family History and Social History were reviewed in [employer] record.     Objective:   Physical  Exam BP 122/80 mmHg  Pulse 76  Ht 5\' 4"  (1.626 m)  Wt 144 lb 4 oz (65.431 kg)  BMI 24.75 kg/m2 Constitutional: Well-developed and well-nourished. No distress. HEENT: Normocephalic and atraumatic.  Conjunctivae are normal.  No scleral icterus. Neck: Neck supple. Trachea midline. Cardiovascular: Normal rate, regular rhythm and intact distal pulses. No M/R/G Pulmonary/chest: Effort normal and breath sounds normal. No wheezing, rales or rhonchi. Abdominal: Soft, nontender, nondistended. Bowel sounds active throughout. There are no masses palpable. No hepatosplenomegaly. Extremities: no clubbing, cyanosis, or edema Lymphadenopathy: No cervical adenopathy noted. Neurological: Alert and oriented to person place and time. Skin: Skin is warm and dry. No rashes noted. Psychiatric: Normal mood and affect. Behavior is normal.  CBC    Component Value Date/Time   WBC 5.5 05/21/2014 0900   RBC 4.81 05/21/2014 0900   HGB 13.4 05/21/2014 0900   HCT 39.9 05/21/2014 0900   PLT 276 05/21/2014 0900   MCV 83.0 05/21/2014 0900   MCH 27.9 05/21/2014 0900   MCHC 33.6 05/21/2014 0900   RDW 12.7 05/21/2014 0900   LYMPHSABS 1.6 02/07/2014 1012   MONOABS 0.4 02/07/2014 1012   EOSABS 0.1 02/07/2014 1012   BASOSABS 0.1 02/07/2014 1012    CMP     Component Value Date/Time   NA 141 05/21/2014 0900   K 4.1 05/21/2014 0900   CL 107 05/21/2014 0900   CO2 28 05/21/2014 0900   GLUCOSE 100* 05/21/2014 0900   BUN 13 05/21/2014 0900   CREATININE 0.65 05/21/2014 0900   CALCIUM 8.9 05/21/2014 0900   PROT 7.9 02/07/2014 1012  ALBUMIN 3.9 02/07/2014 1012   AST 21 02/07/2014 1012   ALT 17 02/07/2014 1012   ALKPHOS 52 02/07/2014 1012   BILITOT 1.1 02/07/2014 1012   GFRNONAA >90 05/21/2014 0900   GFRAA >90 05/21/2014 0900   b12 212 Vit d 26 (then supplemented with high dose vit d x 8 weeks)     Assessment & Plan:   34 year old female with a past medical history of indeterminate colitis diagnosed  October 2012, and rectal carcinoid who is seen in follow-up.  1. IBD/indeterminate colitis -- in remission clinically and endoscopically as of December 2015. The ulcers from the terminal ileum were not that of IBD. She continues to do well and I will reduce Lialda to 2.4 g daily. We'll see her in one year, sooner if necessary should any issues her symptoms arrive. He is up-to-date with flu vaccine and Pneumovax. Repeat labs on return visit including vitamin D and B12 levels. I encouraged her to continue and resume oral vitamin D and oral B12 daily.  2. B12 deficiency/vitamin D deficiency -- B12 1000 my grams daily orally, vitamin D 1000 international units daily orally  3. Rectal carcinoid -- complete resection after surgery in January 2016. No real surveillance guidelines, recommend repeat colonoscopy in 3 years.

## 2014-10-08 IMAGING — CT CT ABD-PELV W/ CM
2 of 4 series · 16 of 46 positions shown, 18 images · IV contrast (CONTRAST)
Comparison: None.

CLINICAL DATA: Generalized abdominal pain since yesterday. Current
history of Crohn's disease.

EXAM:
CT ABDOMEN AND PELVIS WITH CONTRAST
TECHNIQUE: Multidetector CT imaging of the abdomen and pelvis was performed
using the standard protocol following bolus administration of
intravenous contrast.
CONTRAST:  100mL OMNIPAQUE IOHEXOL 300 MG/ML IV. Oral contrast was
also administered.

[Series 2: routine · axial · 0.87mm/px · z∈[-1477,-1062]mm · 13 of 91 slices shown, 15 images]
[im 4/91  soft-tissue]
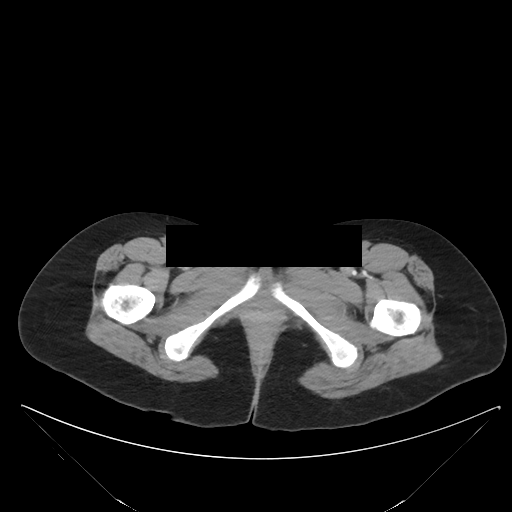
[im 4/91  bone]
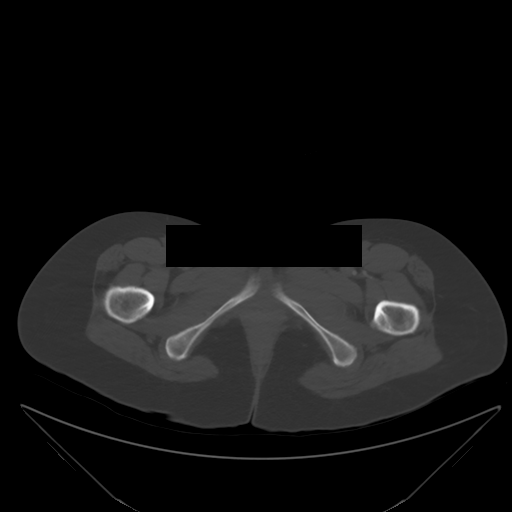
[im 11/91  soft-tissue]
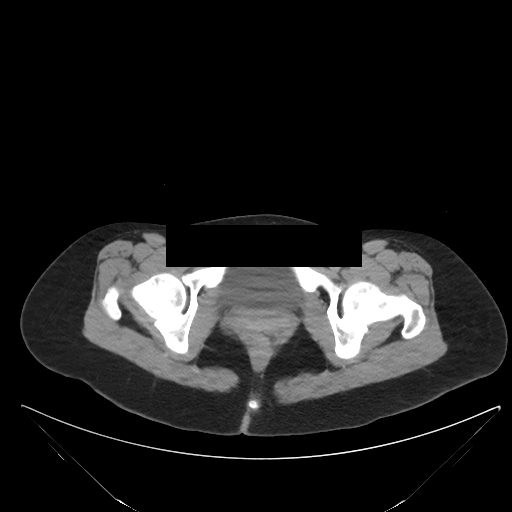
[im 19/91  soft-tissue]
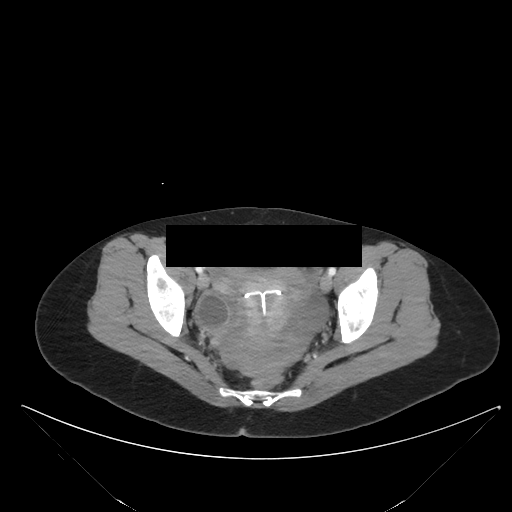
[im 26/91  soft-tissue]
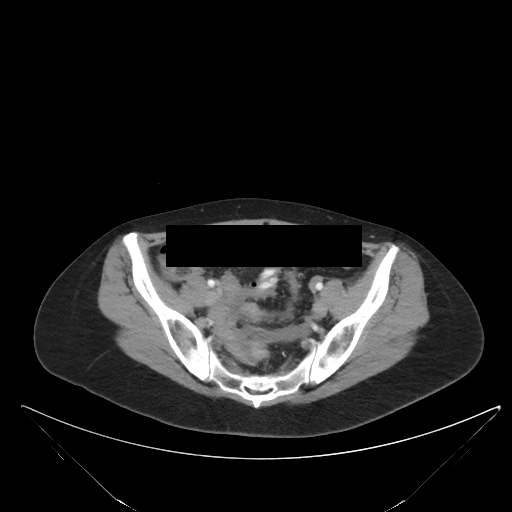
[im 33/91  soft-tissue]
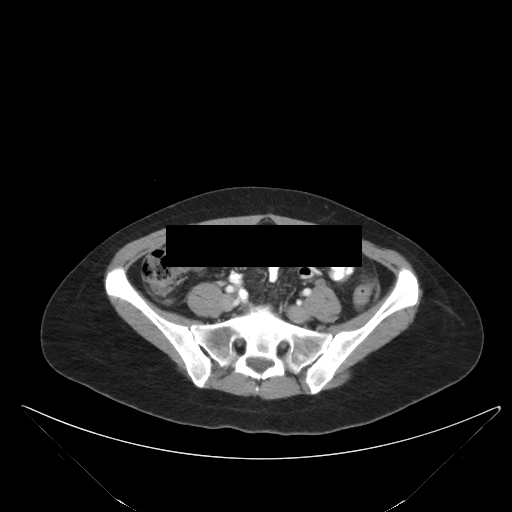
[im 40/91  soft-tissue]
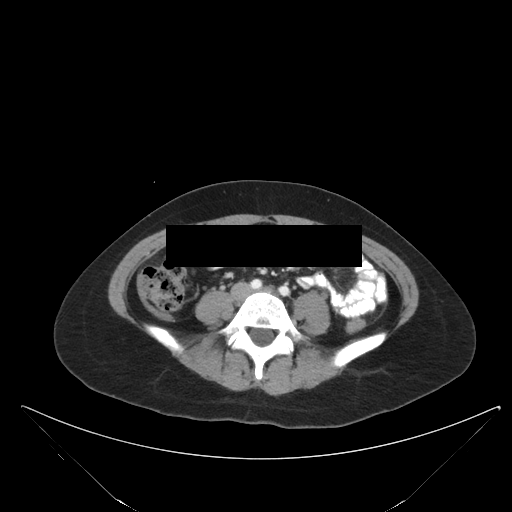
[im 47/91  soft-tissue]
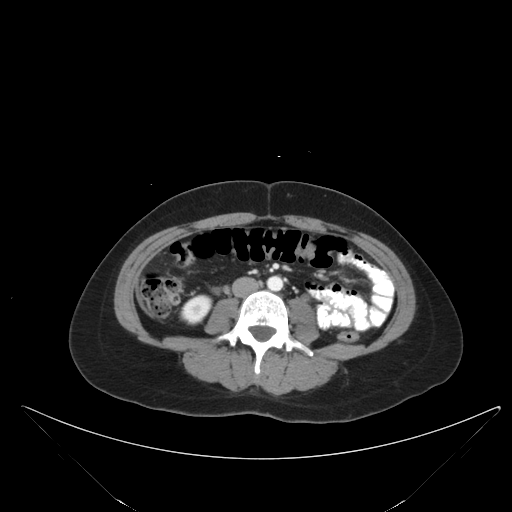
[im 51/91  soft-tissue]
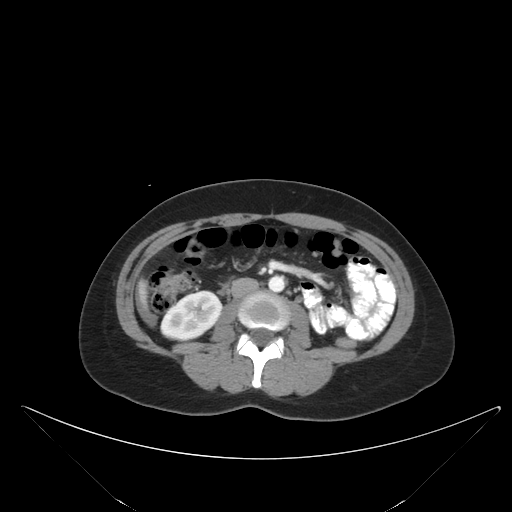
[im 58/91  soft-tissue]
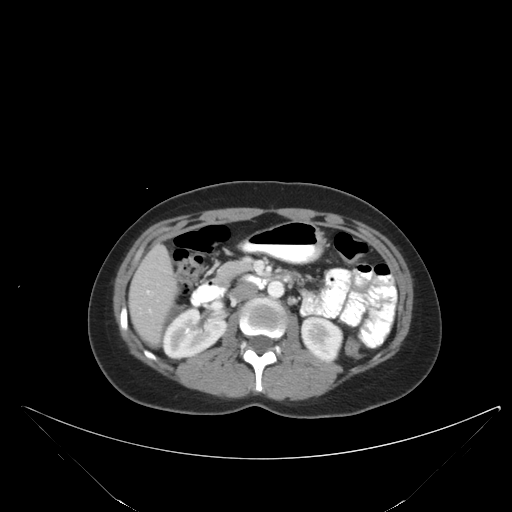
[im 58/91  bone]
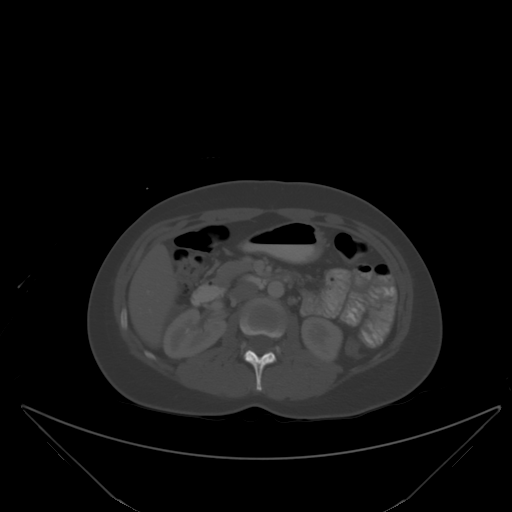
[im 65/91  soft-tissue]
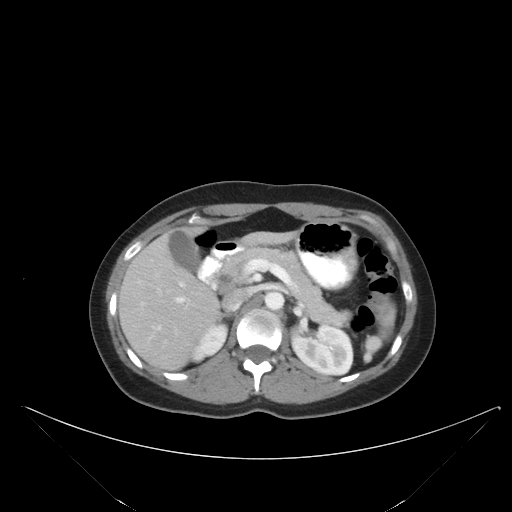
[im 73/91  soft-tissue]
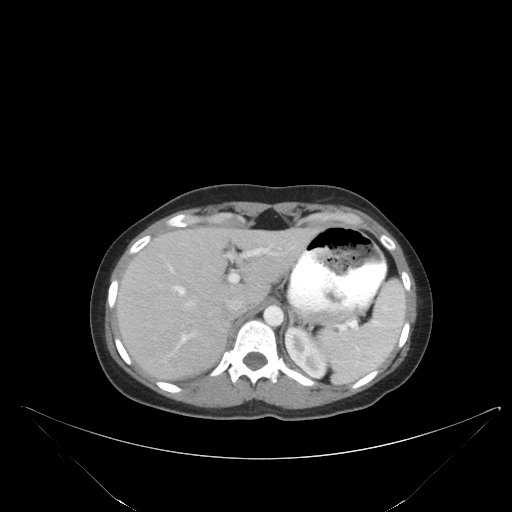
[im 80/91  soft-tissue]
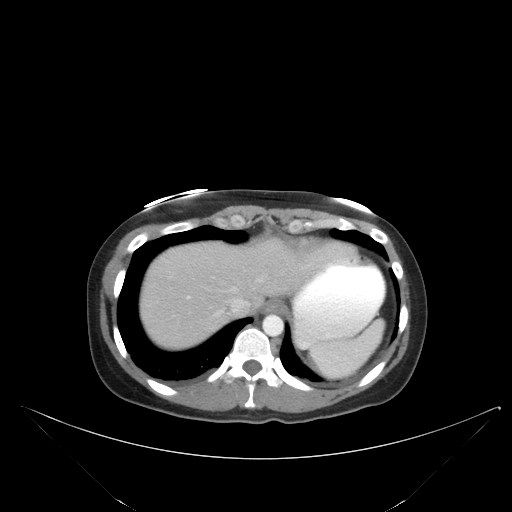
[im 87/91  soft-tissue]
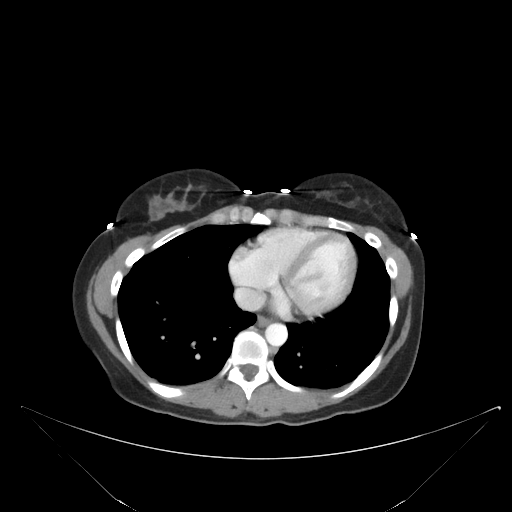

[mpr, coronals, coronal · coronal · 0.88mm/px · 3 of 75 slices shown]
[im 25/75  soft-tissue]
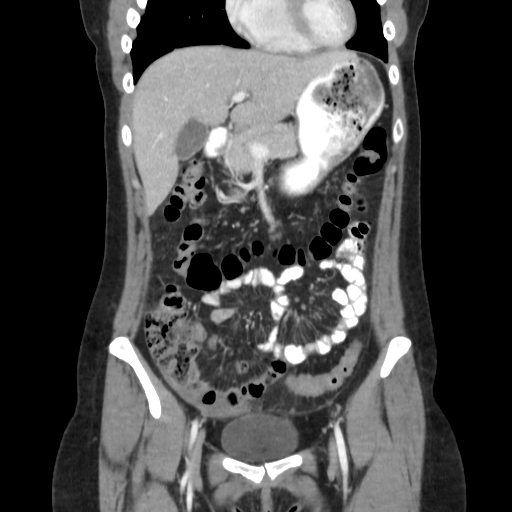
[im 33/75  soft-tissue]
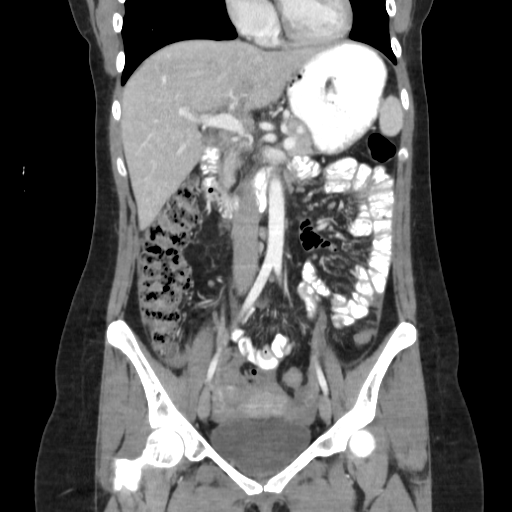
[im 42/75  soft-tissue]
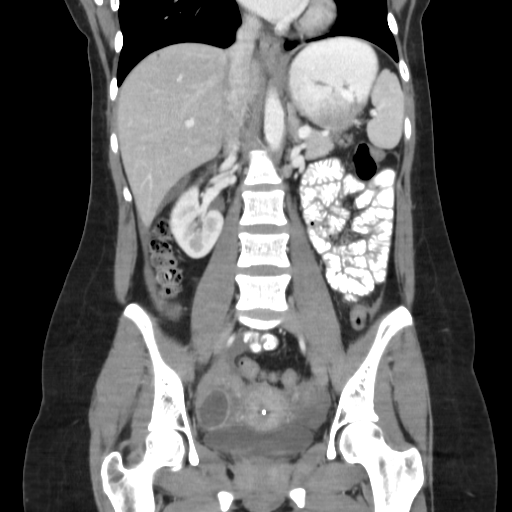

[16 of 46 positions shown; findings below may reference images not displayed]

FINDINGS: Wall thickening involving the entire descending colon and sigmoid
colon, with adjacent edema/ inflammation in the surrounding fat.
Small amount of ascites dependently in the pelvis and in the
paracolic gutters. Scattered diverticula involving the sigmoid
colon. Cecum, ascending colon, and transverse colon uninvolved.
Distal ileum decompressed ; no definite small bowel involvement. No
evidence of bowel obstruction. Normal appendix in the right upper
pelvis. No free intraperitoneal air. Stomach normal in appearance
containing food.

Sub 5 mm low-attenuation lesion consistent with a cyst in the right
lobe of liver near the dome. Indeterminate approximate 0.7 x 0.8 x
1.3 cm low-attenuation lesion in the anterior segment right lobe of
liver (series 2, image 22 and coronal image 57). No other focal
hepatic parenchymal abnormalities. Normal-appearing spleen with 2
foci of accessory splenic tissue inferior to the tip of the spleen.
Normal appearing pancreas, adrenal glands, kidneys, and gallbladder.
No biliary ductal dilation. No visible aorto-iliofemoral
atherosclerosis. Accessory renal arteries noted bilaterally.
Visceral arteries patent. No significant lymphadenopathy.

Uterus normal in size and appearance containing an intrauterine
device. Approximate 3 cm simple cyst arising from the right ovary.
No other adnexal masses. Urinary bladder unremarkable.

Bone window images unremarkable apart from a broad-based central
disc protrusion at L5-S1. Visualized lung bases clear apart from the
expected dependent atelectasis posteriorly. Heart size normal.
IMPRESSION: 1. Inflammation involving the entire descending colon and sigmoid
colon. This may be infectious or a manifestation of an acute Crohn's
flare. No convincing small bowel involvement.
2. Small amount of ascites dependently in the pelvis and in the
paracolic gutters.
3. No evidence of bowel obstruction or free intraperitoneal air.
4. Indeterminate 1.3 cm right lobe liver lesion, statistically a
small hemangioma.

## 2015-06-23 ENCOUNTER — Encounter: Payer: Self-pay | Admitting: Internal Medicine

## 2015-06-24 ENCOUNTER — Telehealth: Payer: Self-pay | Admitting: *Deleted

## 2015-06-24 NOTE — Telephone Encounter (Signed)
-----   Message from Larina Bras, [place] sent at 08/19/2014  3:27 PM EDT ----- Pt needs appt for 1 year follow around 08/19/2015 (see 08/19/14 office note)... Needs to have labs (cbc, cmp, b12, vitamin d) done several days prior.....   Call and remind patient to have labs done and office visit scheduled

## 2015-06-24 NOTE — Telephone Encounter (Signed)
I have left a voicemail for patient to call back. She is scheduled for an appointment with Dr Hilarie Fredrickson on Wednesday, 08/20/15 @ 9:00 am. She needs labs done prior (already in [facility]).

## 2015-06-27 NOTE — Telephone Encounter (Signed)
Patient states that she is starting a new job this month and would like to schedule an appointment for May. I will contact her once our May schedule is out.

## 2015-06-27 NOTE — Telephone Encounter (Signed)
Left voicemail for patient to call back. 

## 2015-08-07 ENCOUNTER — Encounter: Payer: Self-pay | Admitting: *Deleted

## 2015-08-20 ENCOUNTER — Encounter: Payer: Self-pay | Admitting: Internal Medicine

## 2015-08-20 ENCOUNTER — Ambulatory Visit (INDEPENDENT_AMBULATORY_CARE_PROVIDER_SITE_OTHER): Payer: 59 | Admitting: Internal Medicine

## 2015-08-20 VITALS — BP 106/78 | HR 76 | Ht 64.25 in | Wt 145.0 lb

## 2015-08-20 DIAGNOSIS — D3A026 Benign carcinoid tumor of the rectum: Secondary | ICD-10-CM | POA: Diagnosis not present

## 2015-08-20 DIAGNOSIS — K5190 Ulcerative colitis, unspecified, without complications: Secondary | ICD-10-CM

## 2015-08-20 MED ORDER — MESALAMINE 1.2 G PO TBEC: 2.4000 g | DELAYED_RELEASE_TABLET | Freq: Every day | ORAL | Status: DC

## 2015-08-20 NOTE — Patient Instructions (Signed)
We have sent the following medications to your pharmacy for you to pick up at your convenience: Lialda 2 tablets daily  Please follow up with Dr Hilarie Fredrickson in 1 year.  If you are age 36 or older, your body mass index should be between 23-30. Your Body mass index is 24.69 kg/(m^2). If this is out of the aforementioned range listed, please consider follow up with your Primary Care Provider.  If you are age 43 or younger, your body mass index should be between 19-25. Your Body mass index is 24.69 kg/(m^2). If this is out of the aformentioned range listed, please consider follow up with your Primary Care Provider.

## 2015-08-20 NOTE — Progress Notes (Signed)
Subjective:    Patient ID: Kelli Lewis, female    DOB: Dec 13, 1980, 35 y.o.   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 She was last seen one year ago. She reports she is doing well. She feels that she is in clinical remission from her colitis. She has decreased Lialda to 1 tablet daily for unclear reasons. She reports no abdominal pain. No loose stools or bloody stools. No issues with tenesmus or constipation. Appetite has been good. No upper GI complaint including no nausea, vomiting, heartburn or trouble swallowing. No joint symptoms or hepatobiliary complaint.   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 She is not using tobacco. She is trying to get pregnant and recently had her Mirena removed.   Review of Systems As per history of present illness, otherwise negative  Current Medications, Allergies, Past Medical History, Past Surgical History, Family History and Social History were reviewed in [facility] record.     Objective:   Physical Exam BP 106/78 mmHg  Pulse 76  Ht 5' 4.25" (1.632 m)  Wt 145 lb (65.772 kg)  BMI 24.69 kg/m2  LMP 07/23/2015 Constitutional: Well-developed and well-nourished. No distress. HEENT: Normocephalic and atraumatic. Oropharynx is clear and moist. No oropharyngeal exudate. Conjunctivae are normal.  No scleral icterus. Neck: Neck supple. Trachea midline. Cardiovascular: Normal rate, regular rhythm and intact distal pulses. No M/R/G Pulmonary/chest: Effort normal and breath sounds normal. No wheezing, rales or rhonchi. Abdominal: Soft, nontender, nondistended. Bowel sounds active throughout. There are no masses palpable. No hepatosplenomegaly. Extremities: no clubbing, cyanosis, or edema Lymphadenopathy: No cervical adenopathy  noted. Neurological: Alert and oriented to person place and time. Skin: Skin is warm and dry. No rashes noted. Psychiatric: Normal mood and affect. Behavior is normal.  CBC    Component Value Date/Time   WBC 5.5 05/21/2014 0900   RBC 4.81 05/21/2014 0900   HGB 13.4 05/21/2014 0900   HCT 39.9 05/21/2014 0900   PLT 276 05/21/2014 0900   MCV 83.0 05/21/2014 0900   MCH 27.9 05/21/2014 0900   MCHC 33.6 05/21/2014 0900   RDW 12.7 05/21/2014 0900   LYMPHSABS 1.6 02/07/2014 1012   MONOABS 0.4 02/07/2014 1012   EOSABS 0.1 02/07/2014 1012   BASOSABS 0.1 02/07/2014 1012    CMP     Component Value Date/Time   NA 141 05/21/2014 0900   K 4.1 05/21/2014 0900   CL 107 05/21/2014 0900   CO2 28 05/21/2014 0900   GLUCOSE 100* 05/21/2014 0900   BUN 13 05/21/2014 0900   CREATININE 0.65 05/21/2014 0900   CALCIUM 8.9 05/21/2014 0900   PROT 7.9 02/07/2014 1012   ALBUMIN 3.9 02/07/2014 1012   AST 21 02/07/2014 1012   ALT 17 02/07/2014 1012   ALKPHOS 52 02/07/2014 1012   BILITOT 1.1 02/07/2014 1012   GFRNONAA >90 05/21/2014 0900   GFRAA >90 05/21/2014 0900       Assessment & Plan:  35 year old female female with a 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  1. IBD/indeterminate colitis -- clinical remission. I recommended she increase Lialda back to 2.4 g daily which is a more standard dose going for. She is planning to get pregnant and we discussed colitis/IBD in the setting of pregnancy. I  encouraged her to continue mesalamine uninterrupted during pregnancy and afterwards as colitis can flare with the immune system changes of pregnancy. She should discuss her mesalamine with her obstetrician. She told her OB doctor is aware she is on it. Primary care will perform labs at her follow-up next week.  2. B12 deficiency/vitamin D deficiency -- continue oral supplementation.  3. Rectal carcinoid -- complete  resection after surgery in January 2016. We will repeat colonoscopy at the three-year mark.  15 minutes spent with the patient today. Greater than 50% was spent in counseling and coordination of care with the patient

## 2016-02-10 ENCOUNTER — Encounter: Payer: Self-pay | Admitting: Internal Medicine

## 2016-02-10 ENCOUNTER — Telehealth: Payer: Self-pay | Admitting: Internal Medicine

## 2016-02-10 NOTE — Telephone Encounter (Signed)
Yes, ok for colonoscopy

## 2016-02-10 NOTE — Telephone Encounter (Signed)
Pt states the last time she saw Dr. Hilarie Fredrickson they discussed having her colon now instead of 2019. Pt wants to go ahead and schedule colon since she has met her deductible. Please advise.

## 2016-02-10 NOTE — Telephone Encounter (Signed)
OK to schedule colon now per Dr. Hilarie Fredrickson

## 2016-02-10 NOTE — Telephone Encounter (Signed)
Left message for patient to return my call.

## 2016-04-05 ENCOUNTER — Telehealth: Payer: Self-pay | Admitting: Internal Medicine

## 2016-04-05 NOTE — Telephone Encounter (Signed)
Left message for pt to call back.  Pt states she was artificially inseminated on Friday and she might be pregnant. She will not know until 04/16/16. Appts cancelled for previsit and colon. Pt will call back to reschedule colon when she knows. Dr. Hilarie Fredrickson notified.

## 2016-04-15 ENCOUNTER — Encounter: Payer: 59 | Admitting: Internal Medicine

## 2016-07-27 ENCOUNTER — Other Ambulatory Visit: Payer: Self-pay | Admitting: Internal Medicine

## 2016-07-27 DIAGNOSIS — D3A026 Benign carcinoid tumor of the rectum: Secondary | ICD-10-CM

## 2016-08-06 ENCOUNTER — Telehealth: Payer: Self-pay | Admitting: Internal Medicine

## 2016-08-06 DIAGNOSIS — D3A026 Benign carcinoid tumor of the rectum: Secondary | ICD-10-CM

## 2016-08-06 MED ORDER — MESALAMINE 1.2 G PO TBEC: 2.4000 g | DELAYED_RELEASE_TABLET | Freq: Every day | ORAL | 1 refills | Status: DC

## 2016-08-06 NOTE — Telephone Encounter (Signed)
Rx sent 

## 2016-09-15 ENCOUNTER — Encounter: Payer: Self-pay | Admitting: Internal Medicine

## 2016-09-15 ENCOUNTER — Ambulatory Visit (INDEPENDENT_AMBULATORY_CARE_PROVIDER_SITE_OTHER): Payer: 59 | Admitting: Internal Medicine

## 2016-09-15 DIAGNOSIS — K523 Indeterminate colitis: Secondary | ICD-10-CM

## 2016-09-15 MED ORDER — MESALAMINE 1.2 G PO TBEC: 2.4000 g | DELAYED_RELEASE_TABLET | Freq: Every day | ORAL | 10 refills | Status: DC

## 2016-09-15 NOTE — Patient Instructions (Signed)
We have sent the following medications to your pharmacy for you to pick up at your convenience: Lialda 2.4 grams daily  Please follow up in 1 year with Dr Hilarie Fredrickson  If you are age 37 or older, your body mass index should be between 23-30. Your Body mass index is 25.68 kg/m. If this is out of the aforementioned range listed, please consider follow up with your Primary Care Provider.  If you are age 95 or younger, your body mass index should be between 19-25. Your Body mass index is 25.68 kg/m. If this is out of the aformentioned range listed, please consider follow up with your Primary Care Provider.

## 2016-09-15 NOTE — Progress Notes (Signed)
   Subjective:    Patient ID: Kelli Lewis, female    DOB: 20-Jul-1980, 36 y.o.   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 She was seen last in April 2017. She's been maintained on Lialda 2.4 g daily. She reports her colitis has been doing very well. She denies abdominal pain, diarrhea, blood in stool. Good appetite. No oral ulcerations, rashes or new joint pains. No ocular complaints.  She has been working with [employer] on getting pregnant. She had a miscarriage about 6 weeks ago. For several days after this she did have some loose urgent stools without blood. She feels that this was related to stress and resolved completely. She plans to continue to conceive with the help of fertility medications and IUI. She has gained about 12 pounds which she attributes to the hormonal therapies.  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  She reports this job is very good and less stressful than her prior one.  Review of Systems As per history of present illness, otherwise negative  Current Medications, Allergies, Past Medical History, Past Surgical History, Family History and Social History were reviewed in [employer] [facility] record.     Objective:   Physical Exam BP 106/70   Pulse 60   Ht 5\' 4"  (1.626 m)   Wt 149 lb 9.6 oz (67.9 kg)   LMP 09/14/2016 (Exact Date)   BMI 25.68 kg/m  Constitutional: Well-developed and well-nourished. No distress. HEENT: Normocephalic and atraumatic. Oropharynx is clear and moist. Conjunctivae are normal.  No scleral icterus. Neck: Neck supple. Trachea midline. Cardiovascular: Normal rate, regular rhythm and intact distal pulses. No M/R/G Pulmonary/chest: Effort normal and breath sounds normal. No wheezing, rales or rhonchi. Abdominal: Soft, nontender, nondistended. Bowel sounds active  throughout. There are no masses palpable. No hepatosplenomegaly. Extremities: no clubbing, cyanosis, or edema Neurological: Alert and oriented to person place and time. Skin: Skin is warm and dry. Psychiatric: Normal mood and affect. Behavior is normal.     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  1.  IBD/Indeterminate colitis diagnosed in 2012 -- she remains in clinical remission on Lialda. We will continue Lialda 2.4 g daily. I encouraged her to continue mesalamine uninterrupted during and after pregnancy is colitis can flare during this time. She will continue to discuss this with her maternal-fetal medicine specialist and obstetrician. Primary care is monitoring her lab work which reportedly has been normal. Annual flu vaccine recommended. She has previously had Pneumovax.  2. History of rectal carcinoid -- complete surgical resection in January 2016. Plan to repeat colonoscopy in January 2019 or after delivery should she become pregnant before this.  15 minutes spent with the patient today. Greater than 50% was spent in counseling and coordination of care with the patient Annual follow-up, sooner PRN

## 2016-10-05 ENCOUNTER — Other Ambulatory Visit: Payer: Self-pay | Admitting: Internal Medicine

## 2016-10-05 DIAGNOSIS — D3A026 Benign carcinoid tumor of the rectum: Secondary | ICD-10-CM

## 2017-04-26 NOTE — L&D Delivery Note (Signed)
Delivery Note At 3:09 AM a viable female was delivered via Vaginal, Spontaneous OA Presentation   APGAR: 9, 9; weight  pending .   Placenta status:spontaneously with 3 vessel cord , .  Cord:  with the following complications:none .  Cord pH: not obtained  Anesthesia:  epidrual Episiotomy: None Lacerations: 1st degree Suture Repair: 3.0 chromic Est. Blood Loss (mL): 300  Mom to postpartum.  Baby to Couplet care / Skin to Skin.  Ronesha Heenan L 01/07/2018, 3:19 AM

## 2017-05-31 ENCOUNTER — Encounter: Payer: Self-pay | Admitting: Internal Medicine

## 2017-07-01 LAB — OB RESULTS CONSOLE RUBELLA ANTIBODY, IGM: Rubella: IMMUNE

## 2017-07-01 LAB — OB RESULTS CONSOLE HEPATITIS B SURFACE ANTIGEN: Hepatitis B Surface Ag: NEGATIVE

## 2017-07-01 LAB — OB RESULTS CONSOLE GC/CHLAMYDIA
Chlamydia: NEGATIVE
Gonorrhea: NEGATIVE

## 2017-07-01 LAB — OB RESULTS CONSOLE HIV ANTIBODY (ROUTINE TESTING): HIV: NONREACTIVE

## 2017-07-01 LAB — OB RESULTS CONSOLE RPR: RPR: NONREACTIVE

## 2018-01-06 ENCOUNTER — Encounter (HOSPITAL_COMMUNITY): Payer: Self-pay | Admitting: *Deleted

## 2018-01-06 ENCOUNTER — Inpatient Hospital Stay (HOSPITAL_COMMUNITY)
Admission: AD | Admit: 2018-01-06 | Discharge: 2018-01-08 | DRG: 807 | Disposition: A | Discharge status: Home / Self Care | Payer: 59 | Attending: Obstetrics and Gynecology | Admitting: Obstetrics and Gynecology

## 2018-01-06 DIAGNOSIS — O99214 Obesity complicating childbirth: Principal | ICD-10-CM | POA: Diagnosis present

## 2018-01-06 DIAGNOSIS — Z3A37 37 weeks gestation of pregnancy: Secondary | ICD-10-CM

## 2018-01-06 DIAGNOSIS — E669 Obesity, unspecified: Secondary | ICD-10-CM | POA: Diagnosis present

## 2018-01-06 MED ORDER — LACTATED RINGERS IV SOLN
INTRAVENOUS | Status: DC
  Administered 2018-01-06 – 2018-01-07 (×2): via INTRAVENOUS

## 2018-01-06 NOTE — MAU Note (Signed)
SROM at 2215 with some blood in it. HAve had more gushes with clear fld. Mild ctxs. Was 2cm last sve

## 2018-01-07 ENCOUNTER — Inpatient Hospital Stay (HOSPITAL_COMMUNITY): Payer: 59 | Admitting: Anesthesiology

## 2018-01-07 ENCOUNTER — Other Ambulatory Visit: Payer: Self-pay

## 2018-01-07 ENCOUNTER — Encounter (HOSPITAL_COMMUNITY): Payer: Self-pay

## 2018-01-07 DIAGNOSIS — Z3483 Encounter for supervision of other normal pregnancy, third trimester: Secondary | ICD-10-CM | POA: Diagnosis present

## 2018-01-07 DIAGNOSIS — O99214 Obesity complicating childbirth: Secondary | ICD-10-CM | POA: Diagnosis present

## 2018-01-07 DIAGNOSIS — Z3A37 37 weeks gestation of pregnancy: Secondary | ICD-10-CM | POA: Diagnosis not present

## 2018-01-07 DIAGNOSIS — E669 Obesity, unspecified: Secondary | ICD-10-CM | POA: Diagnosis present

## 2018-01-07 LAB — CBC
HCT: 36.4 % (ref 36.0–46.0)
HCT: 36.8 % (ref 36.0–46.0)
Hemoglobin: 12.3 g/dL (ref 12.0–15.0)
Hemoglobin: 12.5 g/dL (ref 12.0–15.0)
MCH: 27.7 pg (ref 26.0–34.0)
MCH: 27.7 pg (ref 26.0–34.0)
MCHC: 33.8 g/dL (ref 30.0–36.0)
MCHC: 34 g/dL (ref 30.0–36.0)
MCV: 81.6 fL (ref 78.0–100.0)
MCV: 82 fL (ref 78.0–100.0)
PLATELETS: 255 10*3/uL (ref 150–400)
Platelets: 236 10*3/uL (ref 150–400)
RBC: 4.44 MIL/uL (ref 3.87–5.11)
RBC: 4.51 MIL/uL (ref 3.87–5.11)
RDW: 16.5 % — ABNORMAL HIGH (ref 11.5–15.5)
RDW: 16.9 % — ABNORMAL HIGH (ref 11.5–15.5)
WBC: 10.5 10*3/uL (ref 4.0–10.5)
WBC: 16 10*3/uL — AB (ref 4.0–10.5)

## 2018-01-07 LAB — TYPE AND SCREEN
ABO/RH(D): A POS
Antibody Screen: NEGATIVE

## 2018-01-07 LAB — POCT FERN TEST: POCT FERN TEST: POSITIVE

## 2018-01-07 LAB — GROUP B STREP BY PCR: GROUP B STREP BY PCR: NEGATIVE

## 2018-01-07 LAB — ABO/RH: ABO/RH(D): A POS

## 2018-01-07 LAB — RPR: RPR: NONREACTIVE

## 2018-01-07 MED ORDER — SENNOSIDES-DOCUSATE SODIUM 8.6-50 MG PO TABS
2.0000 | ORAL_TABLET | ORAL | Status: DC
  Administered 2018-01-07: 2 via ORAL
  Filled 2018-01-07: qty 2

## 2018-01-07 MED ORDER — BENZOCAINE-MENTHOL 20-0.5 % EX AERO: 1.0000 "application " | INHALATION_SPRAY | CUTANEOUS | Status: DC | PRN

## 2018-01-07 MED ORDER — LIDOCAINE HCL (PF) 1 % IJ SOLN
30.0000 mL | INTRAMUSCULAR | Status: DC | PRN
  Filled 2018-01-07: qty 30

## 2018-01-07 MED ORDER — ACETAMINOPHEN 325 MG PO TABS: 650.0000 mg | ORAL_TABLET | ORAL | Status: DC | PRN

## 2018-01-07 MED ORDER — LACTATED RINGERS IV SOLN: 500.0000 mL | INTRAVENOUS | Status: DC | PRN

## 2018-01-07 MED ORDER — LIDOCAINE HCL (PF) 1 % IJ SOLN
INTRAMUSCULAR | Status: DC | PRN
  Administered 2018-01-07 (×2): 4 mL via EPIDURAL

## 2018-01-07 MED ORDER — SIMETHICONE 80 MG PO CHEW: 80.0000 mg | CHEWABLE_TABLET | ORAL | Status: DC | PRN

## 2018-01-07 MED ORDER — ONDANSETRON HCL 4 MG PO TABS: 4.0000 mg | ORAL_TABLET | ORAL | Status: DC | PRN

## 2018-01-07 MED ORDER — MEASLES, MUMPS & RUBELLA VAC ~~LOC~~ INJ
0.5000 mL | INJECTION | Freq: Once | SUBCUTANEOUS | Status: DC
  Filled 2018-01-07: qty 0.5

## 2018-01-07 MED ORDER — IBUPROFEN 600 MG PO TABS
600.0000 mg | ORAL_TABLET | Freq: Four times a day (QID) | ORAL | Status: DC
  Administered 2018-01-07 – 2018-01-08 (×6): 600 mg via ORAL
  Filled 2018-01-07 (×6): qty 1

## 2018-01-07 MED ORDER — ERYTHROMYCIN 5 MG/GM OP OINT
TOPICAL_OINTMENT | OPHTHALMIC | Status: AC
  Filled 2018-01-07: qty 1

## 2018-01-07 MED ORDER — METHYLERGONOVINE MALEATE 0.2 MG/ML IJ SOLN
0.2000 mg | Freq: Once | INTRAMUSCULAR | Status: AC
  Administered 2018-01-07: 0.2 mg via INTRAMUSCULAR
  Filled 2018-01-07: qty 1

## 2018-01-07 MED ORDER — EPHEDRINE 5 MG/ML INJ
10.0000 mg | INTRAVENOUS | Status: DC | PRN
  Filled 2018-01-07: qty 2

## 2018-01-07 MED ORDER — COCONUT OIL OIL
1.0000 "application " | TOPICAL_OIL | Status: DC | PRN
  Filled 2018-01-07: qty 120

## 2018-01-07 MED ORDER — ONDANSETRON HCL 4 MG/2ML IJ SOLN
4.0000 mg | Freq: Four times a day (QID) | INTRAMUSCULAR | Status: DC | PRN
  Administered 2018-01-07: 4 mg via INTRAVENOUS
  Filled 2018-01-07: qty 2

## 2018-01-07 MED ORDER — PHENYLEPHRINE 40 MCG/ML (10ML) SYRINGE FOR IV PUSH (FOR BLOOD PRESSURE SUPPORT)
80.0000 ug | PREFILLED_SYRINGE | INTRAVENOUS | Status: DC | PRN
  Filled 2018-01-07: qty 5

## 2018-01-07 MED ORDER — OXYCODONE-ACETAMINOPHEN 5-325 MG PO TABS: 1.0000 | ORAL_TABLET | ORAL | Status: DC | PRN

## 2018-01-07 MED ORDER — DIPHENHYDRAMINE HCL 50 MG/ML IJ SOLN: 12.5000 mg | INTRAMUSCULAR | Status: DC | PRN

## 2018-01-07 MED ORDER — ONDANSETRON HCL 4 MG/2ML IJ SOLN: 4.0000 mg | INTRAMUSCULAR | Status: DC | PRN

## 2018-01-07 MED ORDER — OXYCODONE-ACETAMINOPHEN 5-325 MG PO TABS: 2.0000 | ORAL_TABLET | ORAL | Status: DC | PRN

## 2018-01-07 MED ORDER — PRENATAL MULTIVITAMIN CH
1.0000 | ORAL_TABLET | Freq: Every day | ORAL | Status: DC
  Administered 2018-01-07 – 2018-01-08 (×2): 1 via ORAL
  Filled 2018-01-07 (×2): qty 1

## 2018-01-07 MED ORDER — PHENYLEPHRINE 40 MCG/ML (10ML) SYRINGE FOR IV PUSH (FOR BLOOD PRESSURE SUPPORT)
80.0000 ug | PREFILLED_SYRINGE | INTRAVENOUS | Status: DC | PRN
  Filled 2018-01-07: qty 10
  Filled 2018-01-07: qty 5

## 2018-01-07 MED ORDER — BISACODYL 10 MG RE SUPP: 10.0000 mg | Freq: Every day | RECTAL | Status: DC | PRN

## 2018-01-07 MED ORDER — MEDROXYPROGESTERONE ACETATE 150 MG/ML IM SUSP: 150.0000 mg | INTRAMUSCULAR | Status: DC | PRN

## 2018-01-07 MED ORDER — OXYTOCIN BOLUS FROM INFUSION
500.0000 mL | Freq: Once | INTRAVENOUS | Status: AC
  Administered 2018-01-07: 500 mL via INTRAVENOUS

## 2018-01-07 MED ORDER — FLEET ENEMA 7-19 GM/118ML RE ENEM: 1.0000 | ENEMA | Freq: Every day | RECTAL | Status: DC | PRN

## 2018-01-07 MED ORDER — WITCH HAZEL-GLYCERIN EX PADS: 1.0000 "application " | MEDICATED_PAD | CUTANEOUS | Status: DC | PRN

## 2018-01-07 MED ORDER — DIBUCAINE 1 % RE OINT: 1.0000 "application " | TOPICAL_OINTMENT | RECTAL | Status: DC | PRN

## 2018-01-07 MED ORDER — ZOLPIDEM TARTRATE 5 MG PO TABS: 5.0000 mg | ORAL_TABLET | Freq: Every evening | ORAL | Status: DC | PRN

## 2018-01-07 MED ORDER — DIPHENHYDRAMINE HCL 25 MG PO CAPS: 25.0000 mg | ORAL_CAPSULE | Freq: Four times a day (QID) | ORAL | Status: DC | PRN

## 2018-01-07 MED ORDER — FENTANYL 2.5 MCG/ML BUPIVACAINE 1/10 % EPIDURAL INFUSION (WH - ANES)
14.0000 mL/h | INTRAMUSCULAR | Status: DC | PRN
  Administered 2018-01-07: 14 mL/h via EPIDURAL
  Filled 2018-01-07: qty 100

## 2018-01-07 MED ORDER — LACTATED RINGERS IV SOLN
500.0000 mL | Freq: Once | INTRAVENOUS | Status: AC
  Administered 2018-01-07: 500 mL via INTRAVENOUS

## 2018-01-07 MED ORDER — TETANUS-DIPHTH-ACELL PERTUSSIS 5-2.5-18.5 LF-MCG/0.5 IM SUSP: 0.5000 mL | Freq: Once | INTRAMUSCULAR | Status: DC

## 2018-01-07 MED ORDER — FLEET ENEMA 7-19 GM/118ML RE ENEM: 1.0000 | ENEMA | RECTAL | Status: DC | PRN

## 2018-01-07 MED ORDER — LACTATED RINGERS IV SOLN: 500.0000 mL | Freq: Once | INTRAVENOUS | Status: DC

## 2018-01-07 MED ORDER — SOD CITRATE-CITRIC ACID 500-334 MG/5ML PO SOLN: 30.0000 mL | ORAL | Status: DC | PRN

## 2018-01-07 MED ORDER — OXYTOCIN 40 UNITS IN LACTATED RINGERS INFUSION - SIMPLE MED
2.5000 [IU]/h | INTRAVENOUS | Status: DC
  Filled 2018-01-07: qty 1000

## 2018-01-07 NOTE — H&P (Signed)
Kelli Lewis is a 37 year old G 3 P 1 at 37 weeks presents with SROM and labor. OB History    Gravida  3   Para  1   Term  1   Preterm  0   AB  1   Living  1     SAB  1   TAB  0   Ectopic  0   Multiple  0   Live Births  1          Past Medical History:  Diagnosis Date  . Allergy   . Anal fissure 01/04/2011  . Anxiety   . Asthma    seasonal allergy induced does not use inhalers  . Colon polyps   . Colon ulcer   . Crohn's disease ([place])   . Diverticulosis   . Duodenal diverticulum   . Family history of malignant neoplasm of gastrointestinal tract   . GERD (gastroesophageal reflux disease)    hx of with pregnancy  . Headache    occas migraines  . Heart murmur   . Herpes   . 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 Rectal carcinoid tumor   . Vitamin B12 deficiency   . Vitamin D deficiency    Past Surgical History:  Procedure Laterality Date  . EUS N/A 05/02/2014   Procedure: LOWER ENDOSCOPIC ULTRASOUND (EUS);  Surgeon: Milus Banister, MD;  Location: [facility];  Service: Endoscopy;  Laterality: N/A;  . MELANOMA EXCISION    . PARTIAL PROCTECTOMY BY TEM N/A 05/23/2014   Procedure: TEM PARTIAL PROCTECTOMY OF RECTAL MASS;  Surgeon: Michael Boston, MD;  Location: [facility];  Service: General;  Laterality: N/A;  . WISDOM TOOTH EXTRACTION     Family History: family history includes Breast cancer in her paternal grandmother; Colon cancer in her paternal grandmother; Colon polyps in her father; Crohn's disease in her paternal grandmother; Irritable bowel syndrome in her mother; Parkinsonism in her father. Social History:  reports that she has never smoked. She has never used smokeless tobacco. She reports that she drinks alcohol. She reports that she does not use drugs.     Maternal Diabetes: No Genetic Screening: Normal Maternal Ultrasounds/Referrals: Normal Fetal Ultrasounds or other Referrals:  None Maternal Substance Abuse:  No Significant  Maternal Medications:  None Significant Maternal Lab Results:  None Other Comments:  None  Review of Systems  All other systems reviewed and are negative.  Maternal Medical History:  Reason for admission: Rupture of membranes and contractions.     Dilation: Lip/rim Effacement (%): 100 Station: 0 Exam by:: Loraine Grip, RN  Blood pressure 101/66, pulse 77, temperature 97.8 F (36.6 C), temperature source Oral, resp. rate 16, height 5\' 4"  (1.626 m), weight 82.6 kg. Maternal Exam:  Uterine Assessment: Contraction strength is moderate.  Contraction frequency is regular.   Abdomen: Fetal presentation: vertex     Fetal Exam Fetal State Assessment: Category I - tracings are normal.     Physical Exam  Nursing note and vitals reviewed. Constitutional: She appears well-developed and well-nourished.  HENT:  Head: Normocephalic and atraumatic.  Eyes: Pupils are equal, round, and reactive to light.  Cardiovascular: Normal rate and regular rhythm.    Prenatal labs: ABO, Rh: --/--/A POS (09/13 2351) Antibody: NEG (09/13 2351) Rubella: Immune (03/08 0000) RPR: Nonreactive (03/08 0000)  HBsAg: Negative (03/08 0000)  HIV: Non-reactive (03/08 0000)  GBS:     Assessment/Plan: IUP at 37 weeks SROM Anticipate NSVD  Antibiotics for unknown GBS Anticipate NSVD   Oday Ridings L 01/07/2018, 2:33 AM

## 2018-01-07 NOTE — Anesthesia Postprocedure Evaluation (Signed)
Anesthesia Post Note  Patient: Kelli Lewis  Procedure(s) Performed: AN AD [place]     Patient location during evaluation: [facility] Anesthesia Type: Epidural Level of consciousness: awake and alert Pain management: pain level controlled Vital Signs Assessment: post-procedure vital signs reviewed and stable Respiratory status: spontaneous breathing, nonlabored ventilation and respiratory function stable Cardiovascular status: stable Postop Assessment: no headache, no backache, epidural receding, able to ambulate, adequate PO intake, no apparent nausea or vomiting and patient able to bend at knees Anesthetic complications: no    Last Vitals:  Vitals:   01/07/18 0532 01/07/18 0640  BP: 130/77 111/77  Pulse: (!) 55 69  Resp: 17 18  Temp: 37.1 C 37.2 C  SpO2: 97% 96%    Last Pain:  Vitals:   01/07/18 0640  TempSrc: Oral  PainSc: 0-No pain   Pain Goal: Patients Stated Pain Goal: 0 (01/06/18 2305)               France Ravens Hristova

## 2018-01-07 NOTE — Anesthesia Preprocedure Evaluation (Signed)
Anesthesia Evaluation  Patient identified by MRN, date of birth, ID band Patient awake    Reviewed: Allergy & Precautions, Patient's Chart, lab work & pertinent test results  Airway Mallampati: III  TM Distance: >3 FB Neck ROM: Full    Dental  (+) Teeth Intact   Pulmonary asthma ,    Pulmonary exam normal breath sounds clear to auscultation       Cardiovascular Normal cardiovascular exam+ Valvular Problems/Murmurs  Rhythm:Regular Rate:Normal     Neuro/Psych  Headaches, negative psych ROS   GI/Hepatic Neg liver ROS, PUD, GERD  Medicated and Controlled,Hx/o rectal carcinoid   Endo/Other  Obesity   Renal/GU negative Renal ROS  negative genitourinary   Musculoskeletal negative musculoskeletal ROS (+)   Abdominal (+) + obese,   Peds  Hematology negative hematology ROS (+)   Anesthesia Other Findings   Reproductive/Obstetrics                             Anesthesia Physical Anesthesia Plan  ASA: II  Anesthesia Plan: Epidural   Post-op Pain Management:    Induction:   PONV Risk Score and Plan:   Airway Management Planned: Natural Airway  Additional Equipment:   Intra-op Plan:   Post-operative Plan:   Informed Consent: I have reviewed the patients History and Physical, chart, labs and discussed the procedure including the risks, benefits and alternatives for the proposed anesthesia with the patient or authorized representative who has indicated his/her understanding and acceptance.     Plan Discussed with: Anesthesiologist  Anesthesia Plan Comments:         Anesthesia Quick Evaluation

## 2018-01-07 NOTE — Anesthesia Procedure Notes (Signed)
Epidural Patient location during procedure: OB Start time: 01/07/2018 1:22 AM End time: 01/07/2018 1:30 AM  Staffing Anesthesiologist: Josephine Igo, MD Performed: anesthesiologist   Preanesthetic Checklist Completed: patient identified, site marked, surgical consent, pre-op evaluation, timeout performed, IV checked, risks and benefits discussed and monitors and equipment checked  Epidural Patient position: sitting Prep: site prepped and draped and DuraPrep Patient monitoring: continuous pulse ox and blood pressure Approach: midline Location: L3-L4 Injection technique: LOR air  Needle:  Needle type: Tuohy  Needle gauge: 17 G Needle length: 9 cm and 9 Needle insertion depth: 6 cm Catheter type: closed end flexible Catheter size: 19 Gauge Catheter at skin depth: 11 cm Test dose: negative and Other  Assessment Events: blood not aspirated, injection not painful, no injection resistance, negative IV test and no paresthesia  Additional Notes Patient identified. Risks and benefits discussed including failed block, incomplete  Pain control, post dural puncture headache, nerve damage, paralysis, blood pressure Changes, nausea, vomiting, reactions to medications-both toxic and allergic and post Partum back pain. All questions were answered. Patient expressed understanding and wished to proceed. Sterile technique was used throughout procedure. Epidural site was Dressed with sterile barrier dressing. No paresthesias, signs of intravascular injection Or signs of intrathecal spread were encountered.  Patient was more comfortable after the epidural was dosed. Please see RN's note for documentation of vital signs and FHR which are stable.

## 2018-01-07 NOTE — Lactation Note (Signed)
This note was copied from a baby's chart. Lactation Consultation Note  Patient Name: Kelli Lewis SNKNL'Z Date: 01/07/2018 Reason for consult: Initial assessment;Early term 9-38.6wks  Visited with P2 Mom of 37 weeks baby at 55 hrs old.  Baby 7 lbs 5.1 oz.  Baby went to breast right at delivery.  Mom experienced breastfeeding her first baby who is now 38 yrs old.  Her first baby was [redacted] weeks gestation.   Talked about normal newborn behavior of an ET infant.   Baby sleeping in crib as he has breastfed 2 times so far.  Baby quiet awake.  Changed a void and meconium stool.  Baby placed STS on Mom.  Mom leaning over in forcing her breast into baby's mouth.  Offered to assist with a more comfortable and efficient position.  Breast massage and hand expression reviewed.  Colostrum noted.  Assisted Mom in using cross cradle hold.  Baby latched before he opened wide enough, Mom winced in pain.  Took baby off, and relatched showing Mom how to wait for a wider gape of mouth.  Baby latched easily.  Showed Mom and FOB how to gently tug on chin to open mouth wider and uncurl lower lip.  Baby has a slight recessed chin.  Taught Mom importance of chin in closer than nose.  Mom taught how to use alternate breast compression.  Swallows identified.  Baby consistently sucking and swallowing on his own, without stimulation needed.    Encouraged as much STS as possible to stimulate baby to cue more.  Feed baby at least every 3 hrs, waking as needed.  If baby doesn't latch due to sleepiness, Mom to hand express and spoon feed baby drops of colostrum.  Talked about double pumping.  Mom to let her RN know if baby becomes sleepy at the breast.  Explained to parents what to look for, deep jaw extensions and swallows.  Goal is >8 feedings per 24 hrs.    Lactation brochure given.  Mom informed of IP and OP lactation support available, and encouraged to call prn for assistance.  Maternal Data Formula Feeding for  Exclusion: No Has patient been taught Hand Expression?: Yes Does the patient have breastfeeding experience prior to this delivery?: Yes  Feeding Feeding Type: Breast Fed Length of feed: (breastfeeding for 15 mins when LC left room)  LATCH Score Latch: Grasps breast easily, tongue down, lips flanged, rhythmical sucking.  Audible Swallowing: Spontaneous and intermittent  Type of Nipple: Everted at rest and after stimulation  Comfort (Breast/Nipple): Soft / non-tender  Hold (Positioning): Assistance needed to correctly position infant at breast and maintain latch.  LATCH Score: 9  Interventions Interventions: Breast feeding basics reviewed;Assisted with latch;Skin to skin;Breast massage;Hand express;Breast compression;Adjust position;Support pillows;Position options  Lactation Tools Discussed/Used WIC Program: No   Consult Status Consult Status: Follow-up Date: 01/08/18 Follow-up type: In-patient    Broadus John 01/07/2018, 12:17 PM

## 2018-01-08 NOTE — Lactation Note (Signed)
This note was copied from a baby's chart. Lactation Consultation Note: Experienced BF mom has baby latched to the breast as I went into room. Reports he has been feeding for about 10 min- going off to sleep. Was circ'd this morning. Reports no pain with latch. Hopes for DC today. Reviewed our phone number to call with questions/concerns. No questions at present. To call prn  Patient Name: Kelli Lewis MCNOB'S Date: 01/08/2018 Reason for consult: Follow-up assessment;Early term 37-38.6wks   Maternal Data Formula Feeding for Exclusion: No Has patient been taught Hand Expression?: Yes Does the patient have breastfeeding experience prior to this delivery?: Yes  Feeding Feeding Type: Breast Fed Length of feed: 5 min  LATCH Score Latch: Grasps breast easily, tongue down, lips flanged, rhythmical sucking.  Audible Swallowing: None  Type of Nipple: Everted at rest and after stimulation  Comfort (Breast/Nipple): Soft / non-tender  Hold (Positioning): No assistance needed to correctly position infant at breast.  LATCH Score: 8  Interventions    Lactation Tools Discussed/Used WIC Program: No   Consult Status Consult Status: Complete    Truddie Crumble 01/08/2018, 12:02 PM

## 2018-01-08 NOTE — Discharge Summary (Signed)
Obstetric Discharge Summary Reason for Admission: rupture of membranes Prenatal Procedures: none Intrapartum Procedures: spontaneous vaginal delivery Postpartum Procedures: none Complications-Operative and Postpartum: first degree perineal laceration Hemoglobin  Date Value Ref Range Status  01/07/2018 12.5 12.0 - 15.0 g/dL Final   HCT  Date Value Ref Range Status  01/07/2018 36.8 36.0 - 46.0 % Final    Physical Exam:  General: alert, cooperative and appears stated age 37: appropriate Uterine Fundus: firm Incision: healing well, no significant drainage, no dehiscence DVT Evaluation: No evidence of DVT seen on physical exam.  Discharge Diagnoses: Term Pregnancy-delivered  Discharge Information: Date: 01/08/2018 Activity: pelvic rest Diet: routine Medications: None Condition: improved Instructions: refer to practice specific booklet Discharge to: home   Newborn Data: Live born female  Birth Weight: 7 lb 5.1 oz (3320 g) APGAR: 9, 9  Newborn Delivery   Birth date/time:  01/[birth year]/2018 03:09:00 Delivery type:  Vaginal, Spontaneous     Home with mother.  Scot Shiraishi L 01/08/2018, 7:28 AM

## 2018-01-08 NOTE — Progress Notes (Signed)
CSW received consult for MOB due to her history of anxiety. CSW met with MOB to discuss her history. MOB confirms diagnosis but stated it was specifically pregnancy related. MOB stated she had some issues initially but they have since resolved. MOB denies any symptoms of anxiety at this time. MOB reports a good support system. CSW encouraged MOB to reach out for assistance if needs arise.  Tyresha Fede, MSW, LCSW-A Clinical Social Worker [facility] [phone]   

## 2018-07-11 ENCOUNTER — Encounter (INDEPENDENT_AMBULATORY_CARE_PROVIDER_SITE_OTHER): Payer: Self-pay

## 2018-07-11 ENCOUNTER — Encounter: Payer: Self-pay | Admitting: Internal Medicine

## 2018-07-11 ENCOUNTER — Other Ambulatory Visit: Payer: Self-pay

## 2018-07-11 ENCOUNTER — Ambulatory Visit: Payer: 59 | Admitting: Internal Medicine

## 2018-07-11 VITALS — BP 118/72 | HR 65 | Temp 97.7°F | Ht 64.0 in | Wt 151.2 lb

## 2018-07-11 DIAGNOSIS — K523 Indeterminate colitis: Secondary | ICD-10-CM

## 2018-07-11 DIAGNOSIS — D3A026 Benign carcinoid tumor of the rectum: Secondary | ICD-10-CM

## 2018-07-11 MED ORDER — SUPREP BOWEL PREP KIT 17.5-3.13-1.6 GM/177ML PO SOLN: 1.0000 | ORAL | 0 refills | Status: DC

## 2018-07-11 NOTE — Patient Instructions (Signed)
You have been scheduled for a colonoscopy. Please follow written instructions given to you at your visit today.  Please pick up your prep supplies at the pharmacy within the next 1-3 days. If you use inhalers (even only as needed), please bring them with you on the day of your procedure. Your physician has requested that you go to www.startemmi.com and enter the access code given to you at your visit today. This web site gives a general overview about your procedure. However, you should still follow specific instructions given to you by our office regarding your preparation for the procedure.  Stay off Blair for now.  If you are age 49 or older, your body mass index should be between 23-30. Your Body mass index is 25.95 kg/m. If this is out of the aforementioned range listed, please consider follow up with your Primary Care Provider.  If you are age 27 or younger, your body mass index should be between 19-25. Your Body mass index is 25.95 kg/m. If this is out of the aformentioned range listed, please consider follow up with your Primary Care Provider.

## 2018-07-11 NOTE — Progress Notes (Signed)
   Subjective:    Patient ID: Kelli Lewis, female    DOB: [birth year], 38 y.o.   MRN: [id number]  HPI Elmira Olkowski is a 38 yo female with PMH of indeterminate colitis, rectal carcinoid, GERD, hx B12 def and Vit D def here for follow-up.  She was last seen in 2018 and is here alone today.  Since her last visit she became pregnant with IVF and had a son who is healthy born in September 2019.  He is now 59 months old and doing well.  She has been off of her Lialda which she had been on for multiple years for approximately the past year.  She did not take the medication during pregnancy and has not had any issues with her colitis.  Her bowel movements have been regular.  No diarrhea.  No rectal bleeding.  She did have an episode of rectal bleeding just after delivery but this resolved after just a day or so.  No abdominal pain.  No fevers or chills.  Good appetite.  No upper GI or hepatobiliary complaint.  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  Review of Systems As per HPI, otherwise negative  Current Medications, Allergies, Past Medical History, Past Surgical History, Family History and Social History were reviewed in [employer] record.     Objective:   Physical Exam BP 118/72   Pulse 65   Temp 97.7 F (36.5 C) (Oral)   Ht 5\' 4"  (1.626 m)   Wt 151 lb 3.2 oz (68.6 kg)   BMI 25.95 kg/m  Gen: awake, alert, NAD HEENT: anicteric, op clear CV: RRR, no mrg Pulm: CTA b/l Abd: soft, NT/ND, +BS throughout Ext: no c/c/e Neuro: nonfocal     Assessment & Plan:  38 yo female with PMH of indeterminate colitis, rectal carcinoid, GERD, hx B12 def and Vit D def here for follow-up.  1.  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  Previously she was taking Lialda 2.4 g daily.  Given that she is due for surveillance colonoscopy for #2 below, I am  going to assess her colitis off of therapy by colonoscopy.  If we do see activity in her colitis we will resume mesalamine therapy.  She is happy with this plan.  2.  History of rectal carcinoid --small polyp seen at colonoscopy in the rectum in 2015 showing carcinoid tumor.  Larger resection performed by Dr. Ardis Hughs after EUS in January 2016 revealed persistent carcinoid and thus she underwent TEM partial proctectomy with Dr. gross in January 2016.  At surgical resection there was no residual carcinoid and margins were clear. I have recommended surveillance colonoscopy at this time.  We discussed the risk, benefits and alternatives and she is agreeable and wishes to proceed. Due to the covid-19 pandemic we are going to delay the procedure by 1 to 2 months.  25 minutes spent with the patient today. Greater than 50% was spent in counseling and coordination of care with the patient

## 2018-08-17 ENCOUNTER — Encounter: Payer: 59 | Admitting: Internal Medicine

## 2018-09-26 ENCOUNTER — Telehealth: Payer: Self-pay

## 2018-09-26 NOTE — Telephone Encounter (Signed)
-----   Message from Algernon Huxley, RN sent at 08/10/2018  1:36 PM EDT ----- Regarding: Colon Pt needs repeat colon\/rectal carcinoid colitis

## 2018-09-26 NOTE — Telephone Encounter (Signed)
Previous appts cancelled due to covid-19. Previsit scheduled 10/13/18@10 :30am, Colon scheduled in the [facility] 11/24/18@10am . Pt aware of appt.

## 2018-10-13 ENCOUNTER — Telehealth: Payer: Self-pay | Admitting: *Deleted

## 2018-10-13 ENCOUNTER — Ambulatory Visit: Payer: 59

## 2018-10-13 ENCOUNTER — Other Ambulatory Visit: Payer: Self-pay

## 2018-10-13 NOTE — Telephone Encounter (Signed)
Attempted to contact x 2, no answer, message left for the patient to call to reschedule the Previsit. Patient made aware if no contact is made by 5 pm, both  Previsit and Colonoscopy will need to be rescheduled

## 2018-10-13 NOTE — Progress Notes (Unsigned)
No egg or soy allergy known to patient  No issues with past sedation with any surgeries  or procedures, no intubation problems  No diet pills per patient No home 02 use per patient  No blood thinners per patient  Pt denies issues with constipation  No A fib or A flutter  EMMI video sent to pt's e mail  

## 2018-11-10 ENCOUNTER — Ambulatory Visit (AMBULATORY_SURGERY_CENTER): Payer: Self-pay | Admitting: *Deleted

## 2018-11-10 ENCOUNTER — Other Ambulatory Visit: Payer: Self-pay

## 2018-11-10 VITALS — Ht 64.0 in | Wt 145.0 lb

## 2018-11-10 DIAGNOSIS — D3A026 Benign carcinoid tumor of the rectum: Secondary | ICD-10-CM

## 2018-11-10 NOTE — Progress Notes (Signed)
Pt's previsit is done over the phone and all paperwork (prep instructions, blank consent form to just read over, pre-procedure acknowledgement form and stamped envelope) sent to patient  Pt has Suprep at home  Pt is aware that care partner will wait in the car during procedure; if they feel like they will be too hot to wait in the car; they may wait in the lobby.  We want them to wear a mask (we do not have any that we can provide them), practice social distancing, and we will check their temperatures when they get here.  I did remind patient that their care partner needs to stay in the parking lot the entire time. Pt will wear mask into building.  No egg or soy allergy  No home oxygen used or hx of sleep apnea  No intubation or anesthesia problems per pt

## 2018-11-23 ENCOUNTER — Telehealth: Payer: Self-pay | Admitting: Internal Medicine

## 2018-11-23 NOTE — Telephone Encounter (Signed)
No answer and mailbox is full, unable to leave message for patient regarding Covid-19 screening questions. Covid-19 Screening Questions:   Do you now or have you had a fever in the last 14 days?    Do you have any respiratory symptoms of shortness of breath or cough now or in the last 14 days?    Do you have any family members or close contacts with diagnosed or suspected Covid-19 in the past 14 days?    Have you been tested for Covid-19 and found to be positive?

## 2018-11-23 NOTE — Telephone Encounter (Signed)
No to all answer °

## 2018-11-24 ENCOUNTER — Other Ambulatory Visit: Payer: Self-pay

## 2018-11-24 ENCOUNTER — Encounter: Payer: Self-pay | Admitting: Internal Medicine

## 2018-11-24 ENCOUNTER — Ambulatory Visit (AMBULATORY_SURGERY_CENTER): Payer: 59 | Admitting: Internal Medicine

## 2018-11-24 ENCOUNTER — Encounter: Payer: 59 | Admitting: Internal Medicine

## 2018-11-24 VITALS — BP 102/72 | HR 71 | Temp 98.5°F | Resp 11 | Ht 64.0 in | Wt 145.0 lb

## 2018-11-24 DIAGNOSIS — K529 Noninfective gastroenteritis and colitis, unspecified: Secondary | ICD-10-CM | POA: Diagnosis not present

## 2018-11-24 DIAGNOSIS — D122 Benign neoplasm of ascending colon: Secondary | ICD-10-CM | POA: Diagnosis not present

## 2018-11-24 DIAGNOSIS — K5730 Diverticulosis of large intestine without perforation or abscess without bleeding: Secondary | ICD-10-CM

## 2018-11-24 DIAGNOSIS — Z86012 Personal history of benign carcinoid tumor: Secondary | ICD-10-CM | POA: Diagnosis not present

## 2018-11-24 DIAGNOSIS — K6289 Other specified diseases of anus and rectum: Secondary | ICD-10-CM | POA: Diagnosis not present

## 2018-11-24 DIAGNOSIS — Z8719 Personal history of other diseases of the digestive system: Secondary | ICD-10-CM | POA: Diagnosis not present

## 2018-11-24 DIAGNOSIS — K523 Indeterminate colitis: Secondary | ICD-10-CM

## 2018-11-24 MED ORDER — SODIUM CHLORIDE 0.9 % IV SOLN: 500.0000 mL | Freq: Once | INTRAVENOUS | Status: AC

## 2018-11-24 MED ORDER — MESALAMINE 1.2 G PO TBEC: 2.4000 g | DELAYED_RELEASE_TABLET | Freq: Every day | ORAL | 10 refills | Status: DC

## 2018-11-24 NOTE — Op Note (Signed)
[facility] Patient Name: Kelli Lewis Procedure Date: 11/24/2018 11:28 AM MRN: [id number] Endoscopist: Jerene Bears , MD Age: 38 Referring MD:  Date of Birth: 1980/07/19 Gender: Female Account #: [id number] Procedure:                Colonoscopy Indications:              Follow-up of indeterminate colitis (cecum/rectum),                            personal history of rectal carcinoid tumor resected                            in Jan 2016 by TEM Medicines:                Monitored Anesthesia Care Procedure:                Pre-Anesthesia Assessment:                           - Prior to the procedure, a History and Physical                            was performed, and patient medications and                            allergies were reviewed. The patient's tolerance of                            previous anesthesia was also reviewed. The risks                            and benefits of the procedure and the sedation                            options and risks were discussed with the patient.                            All questions were answered, and informed consent                            was obtained. Prior Anticoagulants: The patient has                            taken no previous anticoagulant or antiplatelet                            agents. ASA Grade Assessment: II - A patient with                            mild systemic disease. After reviewing the risks                            and benefits, the patient was deemed in  satisfactory condition to undergo the procedure.                           After obtaining informed consent, the colonoscope                            was passed under direct vision. Throughout the                            procedure, the patient's blood pressure, pulse, and                            oxygen saturations were monitored continuously. The                            Colonoscope was introduced through the  anus and                            advanced to the terminal ileum. The colonoscopy was                            performed without difficulty. The patient tolerated                            the procedure well. The quality of the bowel                            preparation was good. The terminal ileum, ileocecal                            valve, appendiceal orifice, and rectum were                            photographed. Scope In: 11:34:19 AM Scope Out: 11:51:19 AM Scope Withdrawal Time: 0 hours 11 minutes 5 seconds  Total Procedure Duration: 0 hours 17 minutes 0 seconds  Findings:                 The digital rectal exam was normal.                           The terminal ileum appeared normal.                           A 5 mm polyp was found in the ascending colon. The                            polyp was sessile. The polyp was removed with a                            cold snare. Resection and retrieval were complete.                           Multiple medium-mouthed diverticula were found in  the sigmoid colon, descending colon and ascending                            colon.                           There was evidence of a prior surgical intervention                            in the rectum. No evidence of recurrent polyp or                            tumor.                           Localized mild inflammation characterized by                            erythema and granularity was found in the rectum.                            Biopsies were taken with a cold forceps for                            histology. No other evidence of colitis in the                            remaining examined colon.                           No additional abnormalities were found on                            retroflexion. Complications:            No immediate complications. Estimated Blood Loss:     Estimated blood loss was minimal. Impression:               - The  examined portion of the ileum was normal.                           - One 5 mm polyp in the ascending colon, removed                            with a cold snare. Resected and retrieved.                           - Diverticulosis in the sigmoid colon, in the                            descending colon and in the ascending colon.                           - Prior surgical resection in the rectum. No  evidence of residual or recurrent polyp.                           - Localized mild inflammation was found in the                            rectum. Biopsied. Recommendation:           - Patient has a contact number available for                            emergencies. The signs and symptoms of potential                            delayed complications were discussed with the                            patient. Return to normal activities tomorrow.                            Written discharge instructions were provided to the                            patient.                           - Resume previous diet.                           - Continue present medications.                           - Can resume Lialda 2.4 g daily versus [place]                            suppository versus waiting on pathology results.                           - Await pathology results.                           - Repeat colonoscopy is recommended. The                            colonoscopy date will be determined after pathology                            results from today's exam become available for                            review. Jerene Bears, MD 11/24/2018 12:01:23 PM This report has been signed electronically.

## 2018-11-24 NOTE — Progress Notes (Signed)
Called to room to assist during endoscopic procedure.  Patient ID and intended procedure confirmed with present staff. Received instructions for my participation in the procedure from the performing physician.  

## 2018-11-24 NOTE — Progress Notes (Signed)
To PACU, VSS. Report to [email or web address] 

## 2018-11-24 NOTE — Progress Notes (Signed)
Pt's states no medical or surgical changes since previsit or office visit. 

## 2018-11-24 NOTE — Progress Notes (Signed)
Temperature taken by P.J., RN, VS taken by C.W., CMA.

## 2018-11-24 NOTE — Patient Instructions (Signed)
Impression/Recommendations:  Polyp handout given to patient. Diverticulosis handout given to patient.  Resume previous diet. Continue present medications.  Resume Lialda.  Oral vs. Suppository depending on pathology results. Await pathology results.  Repeat colonoscopy recommended.  Date to be determined based on pathology results.  YOU HAD AN ENDOSCOPIC PROCEDURE TODAY AT [facility]:   Refer to the procedure report that was given to you for any specific questions about what was found during the examination.  If the procedure report does not answer your questions, please call your gastroenterologist to clarify.  If you requested that your care partner not be given the details of your procedure findings, then the procedure report has been included in a sealed envelope for you to review at your convenience later.  YOU SHOULD EXPECT: Some feelings of bloating in the abdomen. Passage of more gas than usual.  Walking can help get rid of the air that was put into your GI tract during the procedure and reduce the bloating. If you had a lower endoscopy (such as a colonoscopy or flexible sigmoidoscopy) you may notice spotting of blood in your stool or on the toilet paper. If you underwent a bowel prep for your procedure, you may not have a normal bowel movement for a few days.  Please Note:  You might notice some irritation and congestion in your nose or some drainage.  This is from the oxygen used during your procedure.  There is no need for concern and it should clear up in a day or so.  SYMPTOMS TO REPORT IMMEDIATELY:   Following lower endoscopy (colonoscopy or flexible sigmoidoscopy):  Excessive amounts of blood in the stool  Significant tenderness or worsening of abdominal pains  Swelling of the abdomen that is new, acute  Fever of 100F or higher For urgent or emergent issues, a gastroenterologist can be reached at any hour by calling [phone].   DIET:  We do  recommend a small meal at first, but then you may proceed to your regular diet.  Drink plenty of fluids but you should avoid alcoholic beverages for 24 hours.  ACTIVITY:  You should plan to take it easy for the rest of today and you should NOT DRIVE or use heavy machinery until tomorrow (because of the sedation medicines used during the test).    FOLLOW UP: Our staff will call the number listed on your records 48-72 hours following your procedure to check on you and address any questions or concerns that you may have regarding the information given to you following your procedure. If we do not reach you, we will leave a message.  We will attempt to reach you two times.  During this call, we will ask if you have developed any symptoms of COVID 19. If you develop any symptoms (ie: fever, flu-like symptoms, shortness of breath, cough etc.) before then, please call [phone].  If you test positive for Covid 19 in the 2 weeks post procedure, please call and report this information to [place].    If any biopsies were taken you will be contacted by phone or by letter within the next 1-3 weeks.  Please call us at [phone] if you have not heard about the biopsies in 3 weeks.    SIGNATURES/CONFIDENTIALITY: You and/or your care partner have signed paperwork which will be entered into your electronic medical record.  These signatures attest to the fact that that the information above on your After Visit Summary has been reviewed and  is understood.  Full responsibility of the confidentiality of this discharge information lies with you and/or your care-partner.

## 2018-11-28 ENCOUNTER — Telehealth: Payer: Self-pay | Admitting: *Deleted

## 2018-11-28 ENCOUNTER — Telehealth: Payer: Self-pay

## 2018-11-28 NOTE — Telephone Encounter (Signed)
  Follow up Call-  Call back number 11/24/2018  Post procedure Call Back phone  # [phone]  Permission to leave phone message Yes  Some recent data might be hidden     Patient questions:  VM box is full. Second call.

## 2018-11-28 NOTE — Telephone Encounter (Signed)
No answer, unable to leave a message, B.Emanuelle Bastos RN. 

## 2018-11-30 ENCOUNTER — Encounter: Payer: Self-pay | Admitting: Internal Medicine

## 2019-01-10 ENCOUNTER — Telehealth: Payer: Self-pay | Admitting: *Deleted

## 2019-01-10 NOTE — Telephone Encounter (Signed)
error 

## 2019-07-20 ENCOUNTER — Ambulatory Visit: Payer: 59 | Attending: Internal Medicine

## 2019-07-20 DIAGNOSIS — Z23 Encounter for immunization: Secondary | ICD-10-CM

## 2019-07-20 NOTE — Progress Notes (Signed)
   Covid-19 Vaccination Clinic  Name:  Kelli Lewis    MRN: [id number] DOB: 05/20/1980  07/20/2019  [name] was observed post Covid-19 immunization for 15 minutes without incident. She was provided with Vaccine Information Sheet and instruction to access the V-Safe system.   [name] was instructed to call 911 with any severe reactions post vaccine: Marland Kitchen Difficulty breathing  . Swelling of face and throat  . A fast heartbeat  . A bad rash all over body  . Dizziness and weakness   Immunizations Administered    Name Date Dose VIS Date Route   Pfizer COVID-19 Vaccine 07/20/2019  4:51 PM 0.3 mL 04/06/2019 Intramuscular   Manufacturer: [place]   Lot: [id number]   [place]: [id number]

## 2019-08-15 ENCOUNTER — Ambulatory Visit: Payer: 59 | Attending: Internal Medicine

## 2019-08-15 DIAGNOSIS — Z23 Encounter for immunization: Secondary | ICD-10-CM

## 2019-08-15 NOTE — Progress Notes (Signed)
   Covid-19 Vaccination Clinic  Name:  [place]    MRN: [id number] DOB: 1980/10/12  08/15/2019  Ms. Kelli Lewis was observed post Covid-19 immunization for 15 minutes without incident. She was provided with Vaccine Information Sheet and instruction to access the V-Safe system.   Ms. Kelli Lewis was instructed to call 911 with any severe reactions post vaccine: Marland Kitchen Difficulty breathing  . Swelling of face and throat  . A fast heartbeat  . A bad rash all over body  . Dizziness and weakness

## 2019-10-04 ENCOUNTER — Encounter: Payer: Self-pay | Admitting: Orthopedic Surgery

## 2019-10-04 ENCOUNTER — Other Ambulatory Visit: Payer: Self-pay

## 2019-10-04 ENCOUNTER — Ambulatory Visit: Payer: 59

## 2019-10-04 ENCOUNTER — Telehealth: Payer: Self-pay

## 2019-10-04 ENCOUNTER — Ambulatory Visit: Payer: 59 | Admitting: Orthopedic Surgery

## 2019-10-04 VITALS — Ht 64.0 in | Wt 149.0 lb

## 2019-10-04 DIAGNOSIS — M545 Low back pain, unspecified: Secondary | ICD-10-CM

## 2019-10-04 MED ORDER — CELECOXIB 200 MG PO CAPS
ORAL_CAPSULE | ORAL | 0 refills | Status: DC
Start: 2019-10-04 — End: 2019-11-02

## 2019-10-04 NOTE — Progress Notes (Signed)
Office Visit Note   Patient: Kelli Lewis           Date of Birth: 1980-09-06           MRN: [id number] Visit Date: 10/04/2019 Requested by: Lenard Simmer, MD [address] [place],  [place] [address] PCP: Lenard Simmer, MD  Subjective: Chief Complaint  Patient presents with  . Lower Back - Pain    HPI: Kelli Lewis is a 39 y.o. female who presents to the office complaining of low back pain.  Patient notes that she began having low back pain in her pregnancy.  This was about 2 years ago.  She notes that pain was severe at time of pregnancy but has improved since she had her son.  She has a 58-month-old son.  She localizes pain to the inferior axial lumbar spine.  She describes it as an ache.  She had no issues with her back prior to her pregnancy.  She denies any leg symptoms, radicular symptoms, numbness/tingling, weakness, red flag symptoms.  She has never had an MRI of her back.  She has never had surgery on her back.  She does not take any medication aside from the occasional ibuprofen.  She has not tried physical therapy.  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  Her symptoms are worse with extension of the spine.  She has a history of Crohn's disease which she describes as "mild".  She does note acute worsening of her back pain in the last couple weeks after she had a fall and fell onto her butt..                ROS:  All systems reviewed are negative as they relate to the chief complaint within the history of present illness.  Patient denies fevers or chills.  Assessment & Plan: Visit Diagnoses:  1. Acute midline low back pain, unspecified whether sciatica present     Plan: Patient is a 39 year old female who presents with ongoing low back pain since her pregnancy.  She has axial lumbar spine pain with no radicular symptoms.  Pain is worse with extension.  She has never tried physical  therapy and never had an MRI.  Radiographs taken today reveal no acute findings or degenerative changes that are apparent on x-ray.  She does have a history of Crohn's disease but the SI joints appear normal on AP lumbar spine radiograph.  Prescribed Celebrex to take for 2 weeks; this will be the most GI protective anti-inflammatory given her Crohn's disease.  Referred patient to physical therapy to go 1 time a week for 4 weeks.  Ordered MRI of the lumbar spine.  Follow-up in 4 weeks for clinical recheck.  Follow-Up Instructions: No follow-ups on file.   Orders:  Orders Placed This Encounter  Procedures  . XR Lumbar Spine 2-3 Views  . Ambulatory referral to Physical Therapy   Meds ordered this encounter  Medications  . celecoxib (CELEBREX) 200 MG capsule    Sig: 1 po q d x 2 weeks    Dispense:  30 capsule    Refill:  0      Procedures: No procedures performed   Clinical Data: No additional findings.  Objective: Vital Signs: Ht 5\' 4"  (1.626 m)   Wt 149 lb (67.6 kg)   BMI 25.58 kg/m   Physical Exam:  Constitutional: Patient  appears well-developed HEENT:  Head: Normocephalic Eyes:EOM are normal Neck: Normal range of motion Cardiovascular: Normal rate Pulmonary/chest: Effort normal Neurologic: Patient is alert Skin: Skin is warm Psychiatric: Patient has normal mood and affect  Ortho Exam:  Tender to palpation throughout the axial lumbar spine, more so through the inferior lumbar spine.  Tender to palpation throughout the paraspinal musculature bilaterally.  Most tender to palpation over the SI joints.  Negative FABER test.  Mild relief of symptoms with distraction of the SI joint.  5/5 motor strength of the bilateral hip flexors, quadriceps, hamstring, dorsiflexion, plantarflexion.  Sensation intact through all dermatomes of bilateral lower extremities.  No evidence of hypo or hyperreflexia.  Negative straight leg raise bilaterally.  Specialty Comments:  No specialty  comments available.  Imaging: No results found.   PMFS History: Patient Active Problem List   Diagnosis Date Noted  . Indication for care in labor or delivery 01/07/2018  . NSVD (normal spontaneous vaginal delivery) 01/07/2018  . 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 Melanoma of skin, site unspecified 01/22/2013  . IBD (inflammatory bowel disease) 02/26/2011   Past Medical History:  Diagnosis Date  . Allergy   . Anal fissure 01/04/2011  . Anxiety    no per pt  . Asthma    seasonal allergy induced does not use inhalers  . Colon polyps   . Colon ulcer   . Crohn's disease ([place])   . Diverticulosis   . Duodenal diverticulum   . Family history of malignant neoplasm of gastrointestinal tract   . GERD (gastroesophageal reflux disease)    hx of with pregnancy  . Headache    occas migraines  . Heart murmur   . Herpes   . 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 Rectal carcinoid tumor   . Vitamin B12 deficiency   . Vitamin D deficiency     Family History  Problem Relation Age of Onset  . Parkinsonism Father   . Colon polyps Father   . Irritable bowel syndrome Mother   . Colon cancer Paternal Grandmother   . Breast cancer Paternal Grandmother   . Crohn's disease Paternal Grandmother   . Esophageal cancer Neg Hx   . Stomach cancer Neg Hx   . Prostate cancer Neg Hx     Past Surgical History:  Procedure Laterality Date  . COLONOSCOPY    . EUS N/A 05/02/2014   Procedure: LOWER ENDOSCOPIC ULTRASOUND (EUS);  Surgeon: Milus Banister, MD;  Location: [facility];  Service: Endoscopy;  Laterality: N/A;  . MELANOMA EXCISION    . PARTIAL PROCTECTOMY BY TEM N/A 05/23/2014   Procedure: TEM PARTIAL PROCTECTOMY OF RECTAL MASS;  Surgeon: Michael Boston, MD;  Location: [facility];  Service: General;  Laterality: N/A;  . WISDOM TOOTH EXTRACTION     Social History   Occupational History  . 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  Tobacco Use  . Smoking  status: Never Smoker  . Smokeless tobacco: Never Used  Vaping Use  . Vaping Use: Never used  Substance and Sexual Activity  . Alcohol use: Yes    Comment: once monthly - beer  . Drug use: No  . Sexual activity: Yes    Birth control/protection: I.U.D.

## 2019-10-04 NOTE — Telephone Encounter (Signed)
Patient is aware 

## 2019-10-04 NOTE — Telephone Encounter (Signed)
Dr Marlou Sa would like for patient to get an MRI of her lumbar spine but patient would like to know what her out of pocket cost/copay would be for this? What is the easiest way for patient to get this information?

## 2019-10-04 NOTE — Telephone Encounter (Signed)
Pt can call her insurance company and ask them or she can call any of the imaging centers local and talk with someone in the billing/insurance dept. The CPT code she will have to tell them will be 72148 for lumbar spine.

## 2019-10-15 ENCOUNTER — Ambulatory Visit: Payer: 59 | Admitting: Physical Therapy

## 2019-10-22 ENCOUNTER — Other Ambulatory Visit: Payer: Self-pay | Admitting: Obstetrics and Gynecology

## 2019-10-22 DIAGNOSIS — Z1231 Encounter for screening mammogram for malignant neoplasm of breast: Secondary | ICD-10-CM

## 2019-11-02 ENCOUNTER — Other Ambulatory Visit: Payer: Self-pay | Admitting: Orthopedic Surgery

## 2019-11-02 NOTE — Telephone Encounter (Signed)
Pls advise.  

## 2019-11-05 ENCOUNTER — Telehealth: Payer: Self-pay | Admitting: Orthopedic Surgery

## 2019-11-05 ENCOUNTER — Other Ambulatory Visit: Payer: Self-pay | Admitting: Orthopedic Surgery

## 2019-11-05 DIAGNOSIS — M545 Low back pain, unspecified: Secondary | ICD-10-CM

## 2019-11-05 NOTE — Telephone Encounter (Signed)
No order was placed by provider for MRI lumbar spine, I read notes and saw where he was going to order so went ahead and placed the MRI.

## 2019-11-05 NOTE — Telephone Encounter (Signed)
Patient called advised she would like to have her MRI at [facility]. The number to contact patient is [phone]

## 2019-11-06 ENCOUNTER — Ambulatory Visit
Admission: RE | Admit: 2019-11-06 | Discharge: 2019-11-06 | Disposition: A | Discharge status: Home / Self Care | Payer: 59 | Source: Ambulatory Visit | Attending: Obstetrics and Gynecology | Admitting: Obstetrics and Gynecology

## 2019-11-06 ENCOUNTER — Other Ambulatory Visit: Payer: Self-pay

## 2019-11-06 DIAGNOSIS — Z1231 Encounter for screening mammogram for malignant neoplasm of breast: Secondary | ICD-10-CM

## 2019-11-09 ENCOUNTER — Other Ambulatory Visit: Payer: Self-pay | Admitting: Obstetrics and Gynecology

## 2019-11-09 DIAGNOSIS — R928 Other abnormal and inconclusive findings on diagnostic imaging of breast: Secondary | ICD-10-CM

## 2019-11-19 ENCOUNTER — Telehealth: Payer: Self-pay | Admitting: Orthopedic Surgery

## 2019-11-19 DIAGNOSIS — M545 Low back pain, unspecified: Secondary | ICD-10-CM

## 2019-11-19 NOTE — Telephone Encounter (Signed)
Patient called advised she had her MRI today and wanted to see Dr Marlou Sa this week to go over the MRI? Patient said Dr Marlou Sa can also call her with the results. The number to contact patient is [phone]

## 2019-11-19 NOTE — Telephone Encounter (Signed)
Holding until results available

## 2019-11-20 ENCOUNTER — Ambulatory Visit
Admission: RE | Admit: 2019-11-20 | Discharge: 2019-11-20 | Disposition: A | Discharge status: Home / Self Care | Payer: 59 | Source: Ambulatory Visit | Attending: Obstetrics and Gynecology | Admitting: Obstetrics and Gynecology

## 2019-11-20 ENCOUNTER — Other Ambulatory Visit: Payer: Self-pay

## 2019-11-20 ENCOUNTER — Other Ambulatory Visit: Payer: Self-pay | Admitting: Obstetrics and Gynecology

## 2019-11-20 DIAGNOSIS — R928 Other abnormal and inconclusive findings on diagnostic imaging of breast: Secondary | ICD-10-CM

## 2019-11-20 DIAGNOSIS — N63 Unspecified lump in unspecified breast: Secondary | ICD-10-CM

## 2019-11-21 NOTE — Telephone Encounter (Signed)
I called mailbox full could not leave message

## 2019-11-21 NOTE — Telephone Encounter (Signed)
Placed results on your desk. Can you please call her with results?

## 2019-11-22 NOTE — Telephone Encounter (Signed)
Spoke with patient. She said she apologies and is clearing her voicemail now. Requesting she be called again

## 2019-11-22 NOTE — Telephone Encounter (Signed)
See below. Can you please try patient again?

## 2019-11-26 NOTE — Telephone Encounter (Signed)
Laid this on your desk. Can you please call again.

## 2019-11-26 NOTE — Telephone Encounter (Signed)
I called lmom has she had scan yet?

## 2019-11-27 NOTE — Telephone Encounter (Signed)
I called she did not pick up left mssg to get shot from [facility] if needed

## 2019-11-27 NOTE — Telephone Encounter (Signed)
I have put in referral for lumbar ESI.

## 2019-12-03 ENCOUNTER — Ambulatory Visit: Payer: 59 | Admitting: Physical Medicine and Rehabilitation

## 2019-12-03 ENCOUNTER — Encounter: Payer: Self-pay | Admitting: Physical Medicine and Rehabilitation

## 2019-12-03 ENCOUNTER — Other Ambulatory Visit: Payer: Self-pay

## 2019-12-03 ENCOUNTER — Ambulatory Visit: Payer: Self-pay

## 2019-12-03 VITALS — BP 137/93 | HR 73

## 2019-12-03 DIAGNOSIS — M5116 Intervertebral disc disorders with radiculopathy, lumbar region: Secondary | ICD-10-CM

## 2019-12-03 MED ORDER — METHYLPREDNISOLONE ACETATE 80 MG/ML IJ SUSP
80.0000 mg | Freq: Once | INTRAMUSCULAR | Status: AC
  Administered 2019-12-03: 80 mg

## 2019-12-03 NOTE — Progress Notes (Signed)
Pt states Lower back pain. Pt states laying for a long time makes her pain worse. Pt states that she can lift item up and her pain will starte. Pt states she can walking around the ease the pain.  Numeric Pain Rating Scale and Functional Assessment Average Pain 2   In the last MONTH (on 0-10 scale) has pain interfered with the following?  1. General activity like being  able to carry out your everyday physical activities such as walking, climbing stairs, carrying groceries, or moving a chair?  Rating(5)   +Driver, -BT, -Dye Allergies.

## 2019-12-18 NOTE — Progress Notes (Signed)
[facility] [place] - 39 y.o. female MRN [id number]  Date of birth: 08-01-1980  Office Visit Note: Visit Date: 12/03/2019 PCP: Lenard Simmer, MD Referred by: Lenard Simmer, MD  Subjective: Chief Complaint  Patient presents with   Lower Back - Pain   HPI:  Kelli Lewis is a 39 y.o. female who comes in today at the request of Dr. Anderson Malta for planned Right L5-S1 Lumbar epidural steroid injection with fluoroscopic guidance.  The patient has failed conservative care including home exercise, medications, time and activity modification.  This injection will be diagnostic and hopefully therapeutic.  Please see requesting physician notes for further details and justification.  MRI reviewed with images and spine model.  MRI reviewed in the note below.    ROS Otherwise per HPI.  Assessment & Plan: Visit Diagnoses:  1. Radiculopathy due to lumbar intervertebral disc disorder     Plan: No additional findings.   Meds & Orders:  Meds ordered this encounter  Medications   methylPREDNISolone acetate (DEPO-MEDROL) injection 80 mg    Orders Placed This Encounter  Procedures   XR C-ARM NO REPORT   Epidural Steroid injection    Follow-up: Return if symptoms worsen or fail to improve.   Procedures: No procedures performed  Lumbar Epidural Steroid Injection - Interlaminar Approach with Fluoroscopic Guidance  Patient: Kelli Lewis      Date of Birth: [birth year] MRN: [id number] PCP: Lenard Simmer, MD      Visit Date: 12/03/2019   Universal Protocol:     Consent Given By: the patient  Position: PRONE  Additional Comments: Vital signs were monitored before and after the procedure. Patient was prepped and draped in the usual sterile fashion. The correct patient, procedure, and site was verified.   Injection Procedure Details:  Procedure Site One Meds Administered:  Meds ordered this encounter  Medications   methylPREDNISolone acetate (DEPO-MEDROL)  injection 80 mg     Laterality: Right  Location/Site:  L5-S1  Needle size: 20 G  Needle type: Tuohy  Needle Placement: Paramedian epidural  Findings:   -Comments: Excellent flow of contrast into the epidural space.  Procedure Details: Using a paramedian approach from the side mentioned above, the region overlying the inferior lamina was localized under fluoroscopic visualization and the soft tissues overlying this structure were infiltrated with 4 ml. of 1% Lidocaine without Epinephrine. The Tuohy needle was inserted into the epidural space using a paramedian approach.   The epidural space was localized using loss of resistance along with lateral and bi-planar fluoroscopic views.  After negative aspirate for air, blood, and CSF, a 2 ml. volume of Isovue-250 was injected into the epidural space and the flow of contrast was observed. Radiographs were obtained for documentation purposes.    The injectate was administered into the level noted above.   Additional Comments:  The patient tolerated the procedure well Dressing: 2 x 2 sterile gauze and Band-Aid    Post-procedure details: Patient was observed during the procedure. Post-procedure instructions were reviewed.  Patient left the clinic in stable condition.     Clinical History: MRI LUMBAR SPINE WITHOUT CONTRAST   TECHNIQUE:  Multiplanar, multisequence MR imaging of the lumbar spine was  performed. No intravenous contrast was administered.   COMPARISON: None.   FINDINGS:  Segmentation: Standard.   Alignment: 1-2 mm retrolisthesis of L5 on S1.   Vertebrae: No fracture, evidence of discitis, or bone lesion.   Conus medullaris and cauda equina: Conus extends  to the T12 level.  Conus and cauda equina appear normal.   Paraspinal and other soft tissues: No acute paraspinal abnormality.   Disc levels:   Disc spaces: Degenerative disease with mild disc height loss and  disc desiccation at L5-S1.   T12-L1:  No significant disc bulge. No evidence of neuralforaminal  stenosis. No central canal stenosis.   L1-L2: No significant disc bulge. No evidence of neural foraminal  stenosis. No central canal stenosis.   L2-L3: No significant disc bulge. No evidence of neural foraminal  stenosis. No central canal stenosis.   L3-L4: No significant disc bulge. No evidence of neural foraminal  stenosis. No central canal stenosis.   L4-L5: No significant disc bulge. No evidence of neural foraminal  stenosis. No central canal stenosis.   L5-S1: Mild broad-based discbulge with a small central disc  protrusion. No evidence of neural foraminal stenosis. No central  canal stenosis.   IMPRESSION:  1. At L5-S1 there is a mild broad-based disc bulge with a small  central disc protrusion.    Electronically Signed  By: Kathreen Devoid  On: 11/19/2019 08:03     Objective:  VS:  HT:     WT:    BMI:      BP:(!) 137/93   HR:73bpm   TEMP: ( )   RESP:  Physical Exam Constitutional:      General: She is not in acute distress.    Appearance: Normal appearance. She is not ill-appearing.  HENT:     Head: Normocephalic and atraumatic.     Right Ear: External ear normal.     Left Ear: External ear normal.  Eyes:     Extraocular Movements: Extraocular movements intact.  Cardiovascular:     Rate and Rhythm: Normal rate.     Pulses: Normal pulses.  Musculoskeletal:     Right lower leg: No edema.     Left lower leg: No edema.     Comments: Patient has good distal strength with no pain over the greater trochanters.  No clonus or focal weakness.  Skin:    Findings: No erythema, lesion or rash.  Neurological:     General: No focal deficit present.     Mental Status: She is alert and oriented to person, place, and time.     Sensory: No sensory deficit.     Motor: No weakness or abnormal muscle tone.     Coordination: Coordination normal.  Psychiatric:        Mood and Affect: Mood normal.        Behavior:  Behavior normal.      Imaging: No results found.

## 2019-12-18 NOTE — Procedures (Signed)
Lumbar Epidural Steroid Injection - Interlaminar Approach with Fluoroscopic Guidance  Patient: Kelli Lewis      Date of Birth: [birth year] MRN: [id number] PCP: Lenard Simmer, MD      Visit Date: 12/03/2019   Universal Protocol:     Consent Given By: the patient  Position: PRONE  Additional Comments: Vital signs were monitored before and after the procedure. Patient was prepped and draped in the usual sterile fashion. The correct patient, procedure, and site was verified.   Injection Procedure Details:  Procedure Site One Meds Administered:  Meds ordered this encounter  Medications  . methylPREDNISolone acetate (DEPO-MEDROL) injection 80 mg     Laterality: Right  Location/Site:  L5-S1  Needle size: 20 G  Needle type: Tuohy  Needle Placement: Paramedian epidural  Findings:   -Comments: Excellent flow of contrast into the epidural space.  Procedure Details: Using a paramedian approach from the side mentioned above, the region overlying the inferior lamina was localized under fluoroscopic visualization and the soft tissues overlying this structure were infiltrated with 4 ml. of 1% Lidocaine without Epinephrine. The Tuohy needle was inserted into the epidural space using a paramedian approach.   The epidural space was localized using loss of resistance along with lateral and bi-planar fluoroscopic views.  After negative aspirate for air, blood, and CSF, a 2 ml. volume of Isovue-250 was injected into the epidural space and the flow of contrast was observed. Radiographs were obtained for documentation purposes.    The injectate was administered into the level noted above.   Additional Comments:  The patient tolerated the procedure well Dressing: 2 x 2 sterile gauze and Band-Aid    Post-procedure details: Patient was observed during the procedure. Post-procedure instructions were reviewed.  Patient left the clinic in stable condition.

## 2020-05-23 ENCOUNTER — Other Ambulatory Visit: Payer: Self-pay | Admitting: Obstetrics and Gynecology

## 2020-05-23 ENCOUNTER — Ambulatory Visit
Admission: RE | Admit: 2020-05-23 | Discharge: 2020-05-23 | Disposition: A | Discharge status: Home / Self Care | Payer: BC Managed Care – PPO | Source: Ambulatory Visit | Attending: Obstetrics and Gynecology | Admitting: Obstetrics and Gynecology

## 2020-05-23 ENCOUNTER — Other Ambulatory Visit: Payer: Self-pay

## 2020-05-23 DIAGNOSIS — N63 Unspecified lump in unspecified breast: Secondary | ICD-10-CM

## 2020-07-27 ENCOUNTER — Emergency Department (HOSPITAL_BASED_OUTPATIENT_CLINIC_OR_DEPARTMENT_OTHER)
Admission: EM | Admit: 2020-07-27 | Discharge: 2020-07-27 | Disposition: A | Discharge status: Home / Self Care | Payer: BC Managed Care – PPO | Attending: Emergency Medicine | Admitting: Emergency Medicine

## 2020-07-27 ENCOUNTER — Encounter (HOSPITAL_BASED_OUTPATIENT_CLINIC_OR_DEPARTMENT_OTHER): Payer: Self-pay | Admitting: Emergency Medicine

## 2020-07-27 ENCOUNTER — Other Ambulatory Visit: Payer: Self-pay

## 2020-07-27 DIAGNOSIS — J45909 Unspecified asthma, uncomplicated: Secondary | ICD-10-CM | POA: Diagnosis not present

## 2020-07-27 DIAGNOSIS — M545 Low back pain, unspecified: Secondary | ICD-10-CM | POA: Insufficient documentation

## 2020-07-27 DIAGNOSIS — Z79899 Other long term (current) drug therapy: Secondary | ICD-10-CM | POA: Diagnosis not present

## 2020-07-27 DIAGNOSIS — Z85828 Personal history of other malignant neoplasm of skin: Secondary | ICD-10-CM | POA: Diagnosis not present

## 2020-07-27 LAB — URINALYSIS, ROUTINE W REFLEX MICROSCOPIC
Bilirubin Urine: NEGATIVE
Glucose, UA: NEGATIVE mg/dL
Hgb urine dipstick: NEGATIVE
Ketones, ur: NEGATIVE mg/dL
Leukocytes,Ua: NEGATIVE
Nitrite: NEGATIVE
Protein, ur: NEGATIVE mg/dL
Specific Gravity, Urine: 1.01 (ref 1.005–1.030)
pH: 6.5 (ref 5.0–8.0)

## 2020-07-27 LAB — PREGNANCY, URINE: Preg Test, Ur: NEGATIVE

## 2020-07-27 MED ORDER — IBUPROFEN 600 MG PO TABS
600.0000 mg | ORAL_TABLET | Freq: Four times a day (QID) | ORAL | 0 refills | Status: DC | PRN
Start: 2020-07-27 — End: 2020-07-27

## 2020-07-27 MED ORDER — METHOCARBAMOL 500 MG PO TABS
500.0000 mg | ORAL_TABLET | Freq: Two times a day (BID) | ORAL | 0 refills | Status: DC | PRN
Start: 2020-07-27 — End: 2020-07-27

## 2020-07-27 MED ORDER — METHOCARBAMOL 500 MG PO TABS
500.0000 mg | ORAL_TABLET | Freq: Two times a day (BID) | ORAL | 0 refills | Status: DC | PRN
Start: 2020-07-27 — End: 2022-07-02

## 2020-07-27 MED ORDER — IBUPROFEN 600 MG PO TABS
600.0000 mg | ORAL_TABLET | Freq: Four times a day (QID) | ORAL | 0 refills | Status: DC | PRN
Start: 2020-07-27 — End: 2022-07-02

## 2020-07-27 NOTE — ED Triage Notes (Signed)
Pt presents to ED POV. Pt c/o lower back pain. Pt reports that she had UTI last week took OTC meds and other s/s cleared up but back pain persisted.

## 2020-07-27 NOTE — ED Provider Notes (Signed)
[facility] EMERGENCY DEPARTMENT Provider Note   CSN: [id number] Arrival date & time: 07/27/20  0626     History Chief Complaint  Patient presents with  . Back Pain    Kelli Lewis is a 40 y.o. female with no significant past medical history presents the ED with complaints of low back pain.  On my examination, patient reports that 7 days ago she developed suprapubic discomfort consistent with her history of UTI.  She was prescribed Macrobid and states that her urinary symptoms improved.  However, she has continued to experience low back pain.  She states that she is in the process of moving homes and has been doing a lot of lifting and twisting.  She states that her low back symptoms are aggravated by movement.  She has had to stand up because sitting for some period of time will also contribute to lower back discomfort.  She denies any abdominal pain, nausea or vomiting, changes in her bowel habits, fevers since treatment with her antibiotics, numbness or weakness, radicular symptoms in the legs, or other symptoms.  She reports that her back pain has also been ongoing for months and she understands that she likely needs to see her orthopedist, Dr. Marlou Sa, for repeat steroid injection.  She denies any history of IVDA.  She has a history of melanoma, but no active malignancy.  No chronic steroids.  No incontinence.  HPI     Past Medical History:  Diagnosis Date  . Allergy   . Anal fissure 01/04/2011  . Anxiety    no per pt  . Asthma    seasonal allergy induced does not use inhalers  . Colon polyps   . Colon ulcer   . Crohn's disease ([facility])   . Diverticulosis   . Duodenal diverticulum   . Family history of malignant neoplasm of gastrointestinal tract   . GERD (gastroesophageal reflux disease)    hx of with pregnancy  . Headache    occas migraines  . Heart murmur   . Herpes   . 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 Rectal carcinoid  tumor   . Vitamin B12 deficiency   . Vitamin D deficiency     Patient Active Problem List   Diagnosis Date Noted  . Indication for care in labor or delivery 01/07/2018  . NSVD (normal spontaneous vaginal delivery) 01/07/2018  . 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 Melanoma of skin, site unspecified 01/22/2013  . IBD (inflammatory bowel disease) 02/26/2011    Past Surgical History:  Procedure Laterality Date  . COLONOSCOPY    . EUS N/A 05/02/2014   Procedure: LOWER ENDOSCOPIC ULTRASOUND (EUS);  Surgeon: Milus Banister, MD;  Location: [facility];  Service: Endoscopy;  Laterality: N/A;  . MELANOMA EXCISION    . PARTIAL PROCTECTOMY BY TEM N/A 05/23/2014   Procedure: TEM PARTIAL PROCTECTOMY OF RECTAL MASS;  Surgeon: Michael Boston, MD;  Location: [facility];  Service: General;  Laterality: N/A;  . WISDOM TOOTH EXTRACTION       OB History    Gravida  3   Para  2   Term  2   Preterm  0   AB  1   Living  2     SAB  1   IAB  0   Ectopic  0   Multiple  0   Live Births  2           Family History  Problem Relation Age of Onset  . Parkinsonism Father   . Colon polyps Father   . Irritable bowel syndrome Mother   . Colon cancer Paternal Grandmother   . Breast cancer Paternal Grandmother   . Crohn's disease Paternal Grandmother   . Esophageal cancer Neg Hx   . Stomach cancer Neg Hx   . Prostate cancer Neg Hx     Social History   Tobacco Use  . Smoking status: Never Smoker  . Smokeless tobacco: Never Used  Vaping Use  . Vaping Use: Never used  Substance Use Topics  . Alcohol use: Yes    Comment: once monthly - beer  . Drug use: No    Home Medications Prior to Admission medications   Medication Sig Start Date End Date Taking? Authorizing Provider  celecoxib (CELEBREX) 200 MG capsule TAKE 1 CAPSULE BY MOUTH EVERY DAY FOR 2 WEEKS 11/02/19   Magnant, Charles L, PA-C  ibuprofen (ADVIL) 600 MG tablet Take 1 tablet (600 mg total) by mouth every 6 (six) hours as  needed. 07/27/20   Corena Herter, PA-C  mesalamine (LIALDA) 1.2 g EC tablet Take 2 tablets (2.4 g total) by mouth daily with breakfast. 11/24/18   Pyrtle, Lajuan Lines, MD  methocarbamol (ROBAXIN) 500 MG tablet Take 1 tablet (500 mg total) by mouth 2 (two) times daily as needed for muscle spasms. 07/27/20   Corena Herter, PA-C  montelukast (SINGULAIR) 10 MG tablet Take 10 mg by mouth daily as needed (asthma). In the am.    [provider]  Prenatal Vit-Fe Fumarate-FA (PRENATAL MULTIVITAMIN) TABS tablet Take 1 tablet by mouth daily at 12 noon.    [provider]  valACYclovir (VALTREX) 500 MG tablet Take 500 mg by mouth daily. 12/27/17   [provider]  vitamin C (ASCORBIC ACID) 500 MG tablet Take 500 mg by mouth daily.    [provider]    Allergies    Patient has no known allergies.  Review of Systems   Review of Systems  All other systems reviewed and are negative.   Physical Exam Updated Vital Signs BP 120/88   Pulse 76   Temp 98.4 F (36.9 C) (Oral)   Resp 16   Ht 5\' 4"  (1.626 m)   Wt 66.2 kg   SpO2 99%   BMI 25.06 kg/m   Physical Exam Vitals and nursing note reviewed. Exam conducted with a chaperone present.  Constitutional:      General: She is not in acute distress.    Appearance: Normal appearance. She is not ill-appearing or toxic-appearing.  HENT:     Head: Normocephalic and atraumatic.  Eyes:     General: No scleral icterus.    Conjunctiva/sclera: Conjunctivae normal.  Cardiovascular:     Rate and Rhythm: Normal rate.     Pulses: Normal pulses.  Pulmonary:     Effort: Pulmonary effort is normal. No respiratory distress.  Abdominal:     General: Abdomen is flat. There is no distension.     Palpations: Abdomen is soft.     Tenderness: There is no abdominal tenderness. There is no guarding.  Musculoskeletal:     Comments: Moving all extremities.  No significant midline spinal tenderness to palpation.  Describes pain across low  back, but no significant tenderness or overlying skin changes.  Pain exacerbated by bending and other movements.  Negative SLR bilaterally.  Peripheral sensation and pulses intact.  Skin:    General: Skin is dry.  Neurological:  Mental Status: She is alert.     GCS: GCS eye subscore is 4. GCS verbal subscore is 5. GCS motor subscore is 6.  Psychiatric:        Mood and Affect: Mood normal.        Behavior: Behavior normal.        Thought Content: Thought content normal.     ED Results / Procedures / Treatments   Labs (all labs ordered are listed, but only abnormal results are displayed) Labs Reviewed  URINE CULTURE  URINALYSIS, ROUTINE W REFLEX MICROSCOPIC  PREGNANCY, URINE    EKG None  Radiology No results found.  Procedures Procedures   Medications Ordered in ED Medications - No data to display  ED Course  I have reviewed the triage vital signs and the nursing notes.  Pertinent labs & imaging results that were available during my care of the patient were reviewed by me and considered in my medical decision making (see chart for details).    MDM Rules/Calculators/A&P                          Cleta Alberts Older was evaluated in Emergency Department on 07/27/2020 for the symptoms described in the history of present illness. She was evaluated in the context of the global COVID-19 pandemic, which necessitated consideration that the patient might be at risk for infection with the SARS-CoV-2 virus that causes COVID-19. Institutional protocols and algorithms that pertain to the evaluation of patients at risk for COVID-19 are in a state of rapid change based on information released by regulatory bodies including the CDC and federal and state organizations. These policies and algorithms were followed during the patient's care in the ED.  I personally reviewed patient's medical chart and all notes from triage and staff during today's encounter. I have also ordered and reviewed all  labs and imaging that I felt to be medically necessary in the evaluation of this patient's complaints and with consideration of their physical exam. If needed, translation services were available and utilized.   Patient with low back discomfort, likely strain in the context of selling her home and moving lots of boxes.  She states that her symptoms are worse with movement, particularly any bending, lifting, twisting, or extended sitting.  Her symptoms are improved with standing.  She has an IUD placed and denies any pelvic discharge or vaginal bleeding.  She has spotting, but that is normal for her.  She denies any abdominal pain her abdominal exam is entirely benign.  Her vital signs are stable and within normal limits.  No red flags concerning patient's back pain. No s/s of central cord compression or cauda equina. Lower extremities are neurovascularly intact and patient is ambulating without difficulty.  Suspect low back strain.  Will prescribe Robaxin encourage ibuprofen 600 mg 6 hours as needed for symptoms of discomfort.  She states that she had taken ibuprofen at home, with good relief.  She will follow up with her primary care provider as well as her orthopedist, Dr. Marlou Sa, for ongoing evaluation and management.  ED return cautions discussed.  Patient voices understanding and is agreeable.  Final Clinical Impression(s) / ED Diagnoses Final diagnoses:  Acute bilateral low back pain without sciatica    Rx / DC Orders ED Discharge Orders         Ordered    methocarbamol (ROBAXIN) 500 MG tablet  2 times daily PRN,   Status:  Discontinued  07/27/20 1212    ibuprofen (ADVIL) 600 MG tablet  Every 6 hours PRN,   Status:  Discontinued        07/27/20 1212    ibuprofen (ADVIL) 600 MG tablet  Every 6 hours PRN        07/27/20 1213    methocarbamol (ROBAXIN) 500 MG tablet  2 times daily PRN        07/27/20 1213           Reita Chard 07/27/20 1221    Margette Fast,  MD 07/29/20 1007

## 2020-07-27 NOTE — ED Notes (Signed)
Saw ortho, Dr. Nicki Reaper,  last August and received steroid shots in lower back that helped the pain.

## 2020-07-27 NOTE — Discharge Instructions (Signed)
Please take ibuprofen 600 mg every 6 hours as needed for back pain. You were given a prescription for Flexeril which is a muscle relaxer.  You should not drive, work, consume alcohol, or operate machinery while taking this medication as it can make you very drowsy.    Although free to follow-up with your primary care provider regarding today's ED encounter.  Please call the office of Dr. Marlou Sa to schedule appointment for ongoing evaluation and management of your worsening low back pain symptoms.  Your exam here today was reassuring.  UA was without evidence of infection.  Culture has been sent.  Suspect that you have a low back strain, likely in context of moving boxes/homes.  This is supported and that your symptoms are worse with certain movements.  In addition to the medications, please consider lumbar support pillow's while seated for extended periods of time as well as heating pads that can be obtained over-the-counter at a pharmacy.  You may also ultimately benefit from physical therapy.  Return to the ED or seek immediate medical attention should you develop any new or worsening symptoms.

## 2020-07-27 NOTE — ED Provider Notes (Signed)
MSE was initiated and I personally evaluated the patient and placed orders (if any) at  10:06 AM on July 27, 2020.  The patient appears stable so that the remainder of the MSE may be completed by another provider.  Patient with history of disc herniation presents to the ED with complaints of low back pain.  She had been treated with antibiotic for possible UTI, concern for worsening infection.  States that her back pain is getting progressively worse over the course the past week.  She had subjective fevers at work a few days ago.  Followed by Dr. Marlou Sa, orthopedics, and had prednisone injection 8 months ago.   Corena Herter, PA-C 07/27/20 1007    Margette Fast, MD 07/29/20 1007

## 2020-07-28 LAB — URINE CULTURE: Culture: <10000 — AB

## 2020-07-29 ENCOUNTER — Telehealth: Payer: Self-pay | Admitting: Physical Medicine and Rehabilitation

## 2020-07-29 NOTE — Telephone Encounter (Signed)
Patient called. She would like an appointment with Dr. Newton.  

## 2020-07-30 NOTE — Telephone Encounter (Signed)
Pt not req Auth#. 

## 2020-07-30 NOTE — Telephone Encounter (Signed)
Right L5-S1 IL 12/03/19. Ok to repeat if helped, same problem/side, and no new injury?

## 2020-07-30 NOTE — Telephone Encounter (Signed)
ok 

## 2020-07-30 NOTE — Telephone Encounter (Signed)
Is auth needed for repeat Right L5-S1 IL? Scheduled for 4/25 with driver.

## 2020-08-13 ENCOUNTER — Telehealth: Payer: Self-pay | Admitting: Physical Medicine and Rehabilitation

## 2020-08-13 NOTE — Telephone Encounter (Signed)
Patient called to cancel appt. Pt states back is better. No need for return call

## 2020-08-13 NOTE — Telephone Encounter (Signed)
Called pt and cancel appt.

## 2020-08-18 ENCOUNTER — Ambulatory Visit: Payer: BC Managed Care – PPO | Admitting: Physical Medicine and Rehabilitation

## 2020-10-07 ENCOUNTER — Other Ambulatory Visit: Payer: Self-pay | Admitting: Obstetrics and Gynecology

## 2020-10-09 ENCOUNTER — Other Ambulatory Visit: Payer: Self-pay

## 2020-10-09 ENCOUNTER — Other Ambulatory Visit: Payer: Self-pay | Admitting: Obstetrics and Gynecology

## 2020-10-09 ENCOUNTER — Ambulatory Visit
Admission: RE | Admit: 2020-10-09 | Discharge: 2020-10-09 | Disposition: A | Discharge status: Home / Self Care | Payer: BC Managed Care – PPO | Source: Ambulatory Visit | Attending: Obstetrics and Gynecology | Admitting: Obstetrics and Gynecology

## 2020-10-09 DIAGNOSIS — N63 Unspecified lump in unspecified breast: Secondary | ICD-10-CM

## 2020-11-21 ENCOUNTER — Other Ambulatory Visit: Payer: BC Managed Care – PPO

## 2020-11-21 ENCOUNTER — Other Ambulatory Visit: Payer: Self-pay

## 2020-11-21 ENCOUNTER — Ambulatory Visit: Payer: BC Managed Care – PPO | Admitting: Surgical

## 2020-11-21 ENCOUNTER — Encounter: Payer: Self-pay | Admitting: Surgical

## 2020-11-21 DIAGNOSIS — R200 Anesthesia of skin: Secondary | ICD-10-CM | POA: Diagnosis not present

## 2020-11-21 NOTE — Progress Notes (Signed)
Office Visit Note   Patient: Kelli Lewis           Date of Birth: 1980/11/26           MRN: [id number] Visit Date: 11/21/2020 Requested by: Lenard Simmer, MD [address] [place],  [place] [address] PCP: Lenard Simmer, MD  Subjective: Chief Complaint  Patient presents with   Arm Pain    HPI: Kelli Lewis is a 40 y.o. female who presents to the office complaining of left hand numbness.  Patient states that patient has noticed severe left elbow pain that feels like she was hit on her funny bone ever since an insect bite about 3 weeks ago.  The elbow pain was the initial symptoms she had but now over the last 3 weeks she has noticed constant numbness and tingling to her fourth and fifth fingers of the left hand.  She has no history of prior symptoms.  She is right-hand dominant.  She has no history of prior left elbow injury.  Denies any neck discomfort, neck pain, scapular pain, radicular pain down the arm.  She states that keeping her arm straight at night and not bending at the elbow helps with her nighttime symptoms.  She works in [employer] which involves a lot of typing.  No weakness in her hands or dropping any objects that she is noticed.  She does have occasional twinges of pain that feels like hitting her funny bone when she lifts her 35 pound toddler..                ROS: All systems reviewed are negative as they relate to the chief complaint within the history of present illness.  Patient denies fevers or chills.  Assessment & Plan: Visit Diagnoses:  1. Left upper extremity numbness     Plan: Patient is a 40 year old female who presents complaining of left elbow pain with 3 to 4 weeks of left hand fourth and fifth digit numbness/tingling.  She states this all began after a insect bite which caused increased elbow pain before she noticed the numbness and tingling.  She has never had such symptoms before.  No weakness on exam and no subluxing ulnar nerve but she  does have positive Froment's sign.  Plan to order nerve conduction study of the left upper extremity for further evaluation and follow-up after to review results.  She may call and cancel the nerve study if her symptoms resolve 100% over the next several weeks.  Follow-Up Instructions: No follow-ups on file.   Orders:  Orders Placed This Encounter  Procedures   Ambulatory referral to Physical Medicine Rehab   No orders of the defined types were placed in this encounter.     Procedures: No procedures performed   Clinical Data: No additional findings.  Objective: Vital Signs: There were no vitals taken for this visit.  Physical Exam:  Constitutional: Patient appears well-developed HEENT:  Head: Normocephalic Eyes:EOM are normal Neck: Normal range of motion Cardiovascular: Normal rate Pulmonary/chest: Effort normal Neurologic: Patient is alert Skin: Skin is warm Psychiatric: Patient has normal mood and affect  Ortho Exam: Ortho exam demonstrates left hand with decreased sensation over the volar aspect of the fourth and fifth fingers.  Intact EPL, wrist extension, finger abduction, finger adduction.  No muscle wasting noted.  Negative elbow flexion sign.  No subluxing ulnar nerve is palpable.  Positive Froment's sign on left, negative on right.  Negative Tinel sign over the wrist.  Negative Phalen sign.  Negative Allen test  Specialty Comments:  No specialty comments available.  Imaging: No results found.   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   Melanoma of skin, site unspecified 01/22/2013   IBD (inflammatory bowel disease) 02/26/2011   Past Medical History:  Diagnosis Date   Allergy    Anal fissure 01/04/2011   Anxiety    no per pt   Asthma    seasonal allergy induced does not use inhalers    Colon polyps    Colon ulcer    Crohn's disease ([place])    Diverticulosis    Duodenal diverticulum    Family history of malignant neoplasm of gastrointestinal tract    GERD (gastroesophageal reflux disease)    hx of with pregnancy   Headache    occas migraines   Heart murmur    Herpes    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, melanoma   Rectal carcinoid tumor    Vitamin B12 deficiency    Vitamin D deficiency     Family History  Problem Relation Age of Onset   Parkinsonism Father    Colon polyps Father    Irritable bowel syndrome Mother    Colon cancer Paternal Grandmother    Breast cancer Paternal Grandmother    Crohn's disease Paternal Grandmother    Esophageal cancer Neg Hx    Stomach cancer Neg Hx    Prostate cancer Neg Hx     Past Surgical History:  Procedure Laterality Date   COLONOSCOPY     EUS N/A 05/02/2014   Procedure: LOWER ENDOSCOPIC ULTRASOUND (EUS);  Surgeon: Milus Banister, MD;  Location: [facility];  Service: Endoscopy;  Laterality: N/A;   MELANOMA EXCISION     PARTIAL PROCTECTOMY BY TEM N/A 05/23/2014   Procedure: TEM PARTIAL PROCTECTOMY OF RECTAL MASS;  Surgeon: Michael Boston, MD;  Location: [facility];  Service: General;  Laterality: N/A;   WISDOM TOOTH EXTRACTION     Social History   Occupational History   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  Tobacco Use   Smoking status: Never   Smokeless tobacco: Never  Vaping Use   Vaping Use: Never used  Substance and Sexual Activity   Alcohol use: Yes    Comment: once monthly - beer   Drug use: No   Sexual activity: Yes    Birth control/protection: I.U.D.

## 2020-11-24 ENCOUNTER — Other Ambulatory Visit: Payer: Self-pay

## 2020-11-24 ENCOUNTER — Ambulatory Visit
Admission: RE | Admit: 2020-11-24 | Discharge: 2020-11-24 | Disposition: A | Discharge status: Home / Self Care | Payer: BC Managed Care – PPO | Source: Ambulatory Visit | Attending: Obstetrics and Gynecology | Admitting: Obstetrics and Gynecology

## 2020-11-24 DIAGNOSIS — N63 Unspecified lump in unspecified breast: Secondary | ICD-10-CM

## 2020-11-26 ENCOUNTER — Telehealth: Payer: Self-pay | Admitting: Physical Medicine and Rehabilitation

## 2020-11-26 NOTE — Telephone Encounter (Signed)
Pt called to schedule a nerve study.  [phone]

## 2021-01-27 ENCOUNTER — Ambulatory Visit (INDEPENDENT_AMBULATORY_CARE_PROVIDER_SITE_OTHER): Payer: BC Managed Care – PPO | Admitting: Physical Medicine and Rehabilitation

## 2021-01-27 ENCOUNTER — Other Ambulatory Visit: Payer: Self-pay

## 2021-01-27 ENCOUNTER — Encounter: Payer: Self-pay | Admitting: Physical Medicine and Rehabilitation

## 2021-01-27 DIAGNOSIS — R202 Paresthesia of skin: Secondary | ICD-10-CM | POA: Diagnosis not present

## 2021-01-27 NOTE — Progress Notes (Signed)
Numbness and pain medial side of left hand. Difficulty opening things. Fourth and fifth fingers are the worst. Feels like symptoms have gotten worse.  Right hand dominant +Lotion

## 2021-01-28 NOTE — Procedures (Signed)
EMG & NCV Findings: All nerve conduction studies (as indicated in the following tables) were within normal limits.    All examined muscles (as indicated in the following table) showed no evidence of electrical instability.    Impression: Essentially NORMAL electrodiagnostic study of the left upper limb.  There is no significant electrodiagnostic evidence of nerve entrapment, brachial plexopathy or cervical radiculopathy.    As you know, purely sensory or demyelinating radiculopathies and chemical radiculitis may not be detected with this particular electrodiagnostic study.  Recommendations: 1.  Follow-up with referring physician. 2.  Continue current management of symptoms.  ___________________________ Laurence Spates FAAPMR Board Certified, American Board of Physical Medicine and Rehabilitation    Nerve Conduction Studies Anti Sensory Summary Table   Stim Site NR Peak (ms) Norm Peak (ms) P-T Amp (V) Norm P-T Amp Site1 Site2 Delta-P (ms) Dist (cm) Vel (m/s) Norm Vel (m/s)  Left Median Acr Palm Anti Sensory (2nd Digit)  29.8C  Wrist    3.1 <3.6 63.7 >10 Wrist Palm 1.5 0.0    Palm    1.6 <2.0 76.9         Left Radial Anti Sensory (Base 1st Digit)  29.6C  Wrist    2.1 <3.1 38.9  Wrist Base 1st Digit 2.1 0.0    Left Ulnar Anti Sensory (5th Digit)  30.3C  Wrist    3.4 <3.7 21.1 >15.0 Wrist 5th Digit 3.4 14.0 41 >38   Motor Summary Table   Stim Site NR Onset (ms) Norm Onset (ms) O-P Amp (mV) Norm O-P Amp Site1 Site2 Delta-0 (ms) Dist (cm) Vel (m/s) Norm Vel (m/s)  Left Median Motor (Abd Poll Brev)  30.3C  Wrist    2.8 <4.2 10.2 >5 Elbow Wrist 3.2 19.5 61 >50  Elbow    6.0  10.2         Left Ulnar Motor (Abd Dig Min)  30.4C  Wrist    3.0 <4.2 8.7 >3 B Elbow Wrist 3.3 18.5 56 >53  B Elbow    6.3  7.7  A Elbow B Elbow 1.4 9.0 64 >53  A Elbow    7.7  7.9          EMG   Side Muscle Nerve Root Ins Act Fibs Psw Amp Dur Poly Recrt Int Fraser Din Comment  Left Abd Poll Brev Median C8-T1 Nml  Nml Nml Nml Nml 0 Nml Nml   Left 1stDorInt Ulnar C8-T1 Nml Nml Nml Nml Nml 0 Nml Nml   Left PronatorTeres Median C6-7 Nml Nml Nml Nml Nml 0 Nml Nml   Left Biceps Musculocut C5-6 Nml Nml Nml Nml Nml 0 Nml Nml   Left Deltoid Axillary C5-6 Nml Nml Nml Nml Nml 0 Nml Nml     Nerve Conduction Studies Anti Sensory Left/Right Comparison   Stim Site L Lat (ms) R Lat (ms) L-R Lat (ms) L Amp (V) R Amp (V) L-R Amp (%) Site1 Site2 L Vel (m/s) R Vel (m/s) L-R Vel (m/s)  Median Acr Palm Anti Sensory (2nd Digit)  29.8C  Wrist 3.1   63.7   Wrist Palm     Palm 1.6   76.9         Radial Anti Sensory (Base 1st Digit)  29.6C  Wrist 2.1   38.9   Wrist Base 1st Digit     Ulnar Anti Sensory (5th Digit)  30.3C  Wrist 3.4   21.1   Wrist 5th Digit 41     Motor Left/Right Comparison  Stim Site L Lat (ms) R Lat (ms) L-R Lat (ms) L Amp (mV) R Amp (mV) L-R Amp (%) Site1 Site2 L Vel (m/s) R Vel (m/s) L-R Vel (m/s)  Median Motor (Abd Poll Brev)  30.3C  Wrist 2.8   10.2   Elbow Wrist 61    Elbow 6.0   10.2         Ulnar Motor (Abd Dig Min)  30.4C  Wrist 3.0   8.7   B Elbow Wrist 56    B Elbow 6.3   7.7   A Elbow B Elbow 64    A Elbow 7.7   7.9            Waveforms:

## 2021-01-28 NOTE — Progress Notes (Signed)
[facility] [place] - 40 y.o. female MRN [id number]  Date of birth: June 01, 1980  Office Visit Note: Visit Date: 01/27/2021 PCP: Lenard Simmer, MD Referred by: Lenard Simmer, MD  Subjective: Chief Complaint  Patient presents with   Left Hand - Pain, Numbness   HPI:  Kelli Lewis is a 40 y.o. female who comes in today at the request of Dr. Anderson Malta for electrodiagnostic study of the Left upper extremities.  Patient is Right hand dominant. She reports chronic worsening pain and numbness in the ulnar side of the left hand. She endorses worsening issues with opening jars etc.    ROS Otherwise per HPI.  Assessment & Plan: Visit Diagnoses:    ICD-10-CM   1. Paresthesia of skin  R20.2 NCV with EMG (electromyography)      Plan: Impression: Essentially NORMAL electrodiagnostic study of the left upper limb.  There is no significant electrodiagnostic evidence of nerve entrapment, brachial plexopathy or cervical radiculopathy.    As you know, purely sensory or demyelinating radiculopathies and chemical radiculitis may not be detected with this particular electrodiagnostic study.  Recommendations: 1.  Follow-up with referring physician. 2.  Continue current management of symptoms.  Meds & Orders: No orders of the defined types were placed in this encounter.   Orders Placed This Encounter  Procedures   NCV with EMG (electromyography)    Follow-up: Return in about 2 weeks (around 02/10/2021) for G. Alphonzo Severance, MD.   Procedures: No procedures performed  EMG & NCV Findings: All nerve conduction studies (as indicated in the following tables) were within normal limits.    All examined muscles (as indicated in the following table) showed no evidence of electrical instability.    Impression: Essentially NORMAL electrodiagnostic study of the left upper limb.  There is no significant electrodiagnostic evidence of nerve entrapment, brachial plexopathy or cervical radiculopathy.     As you know, purely sensory or demyelinating radiculopathies and chemical radiculitis may not be detected with this particular electrodiagnostic study.  Recommendations: 1.  Follow-up with referring physician. 2.  Continue current management of symptoms.  ___________________________ Laurence Spates FAAPMR Board Certified, American Board of Physical Medicine and Rehabilitation    Nerve Conduction Studies Anti Sensory Summary Table   Stim Site NR Peak (ms) Norm Peak (ms) P-T Amp (V) Norm P-T Amp Site1 Site2 Delta-P (ms) Dist (cm) Vel (m/s) Norm Vel (m/s)  Left Median Acr Palm Anti Sensory (2nd Digit)  29.8C  Wrist    3.1 <3.6 63.7 >10 Wrist Palm 1.5 0.0    Palm    1.6 <2.0 76.9         Left Radial Anti Sensory (Base 1st Digit)  29.6C  Wrist    2.1 <3.1 38.9  Wrist Base 1st Digit 2.1 0.0    Left Ulnar Anti Sensory (5th Digit)  30.3C  Wrist    3.4 <3.7 21.1 >15.0 Wrist 5th Digit 3.4 14.0 41 >38   Motor Summary Table   Stim Site NR Onset (ms) Norm Onset (ms) O-P Amp (mV) Norm O-P Amp Site1 Site2 Delta-0 (ms) Dist (cm) Vel (m/s) Norm Vel (m/s)  Left Median Motor (Abd Poll Brev)  30.3C  Wrist    2.8 <4.2 10.2 >5 Elbow Wrist 3.2 19.5 61 >50  Elbow    6.0  10.2         Left Ulnar Motor (Abd Dig Min)  30.4C  Wrist    3.0 <4.2 8.7 >3 B Elbow Wrist 3.3 18.5  56 >53  B Elbow    6.3  7.7  A Elbow B Elbow 1.4 9.0 64 >53  A Elbow    7.7  7.9          EMG   Side Muscle Nerve Root Ins Act Fibs Psw Amp Dur Poly Recrt Int Fraser Din Comment  Left Abd Poll Brev Median C8-T1 Nml Nml Nml Nml Nml 0 Nml Nml   Left 1stDorInt Ulnar C8-T1 Nml Nml Nml Nml Nml 0 Nml Nml   Left PronatorTeres Median C6-7 Nml Nml Nml Nml Nml 0 Nml Nml   Left Biceps Musculocut C5-6 Nml Nml Nml Nml Nml 0 Nml Nml   Left Deltoid Axillary C5-6 Nml Nml Nml Nml Nml 0 Nml Nml     Nerve Conduction Studies Anti Sensory Left/Right Comparison   Stim Site L Lat (ms) R Lat (ms) L-R Lat (ms) L Amp (V) R Amp (V) L-R Amp (%) Site1  Site2 L Vel (m/s) R Vel (m/s) L-R Vel (m/s)  Median Acr Palm Anti Sensory (2nd Digit)  29.8C  Wrist 3.1   63.7   Wrist Palm     Palm 1.6   76.9         Radial Anti Sensory (Base 1st Digit)  29.6C  Wrist 2.1   38.9   Wrist Base 1st Digit     Ulnar Anti Sensory (5th Digit)  30.3C  Wrist 3.4   21.1   Wrist 5th Digit 41     Motor Left/Right Comparison   Stim Site L Lat (ms) R Lat (ms) L-R Lat (ms) L Amp (mV) R Amp (mV) L-R Amp (%) Site1 Site2 L Vel (m/s) R Vel (m/s) L-R Vel (m/s)  Median Motor (Abd Poll Brev)  30.3C  Wrist 2.8   10.2   Elbow Wrist 61    Elbow 6.0   10.2         Ulnar Motor (Abd Dig Min)  30.4C  Wrist 3.0   8.7   B Elbow Wrist 56    B Elbow 6.3   7.7   A Elbow B Elbow 64    A Elbow 7.7   7.9            Waveforms:            Clinical History: MRI LUMBAR SPINE WITHOUT CONTRAST   TECHNIQUE:  Multiplanar, multisequence MR imaging of the lumbar spine was  performed. No intravenous contrast was administered.   COMPARISON:  None.   FINDINGS:  Segmentation:  Standard.   Alignment:  1-2 mm retrolisthesis of L5 on S1.   Vertebrae:  No fracture, evidence of discitis, or bone lesion.   Conus medullaris and cauda equina: Conus extends to the T12 level.  Conus and cauda equina appear normal.   Paraspinal and other soft tissues: No acute paraspinal abnormality.   Disc levels:   Disc spaces: Degenerative disease with mild disc height loss and  disc desiccation at L5-S1.   T12-L1: No significant disc bulge. No evidence of neural foraminal  stenosis. No central canal stenosis.   L1-L2: No significant disc bulge. No evidence of neural foraminal  stenosis. No central canal stenosis.   L2-L3: No significant disc bulge. No evidence of neural foraminal  stenosis. No central canal stenosis.   L3-L4: No significant disc bulge. No evidence of neural foraminal  stenosis. No central canal stenosis.   L4-L5: No significant disc bulge. No evidence of neural  foraminal  stenosis. No central canal stenosis.  L5-S1: Mild broad-based disc bulge with a small central disc  protrusion. No evidence of neural foraminal stenosis. No central  canal stenosis.   IMPRESSION:  1. At L5-S1 there is a mild broad-based disc bulge with a small  central disc protrusion.    Electronically Signed    By: Kathreen Devoid    On: 11/19/2019 08:03     Objective:  VS:  HT:    WT:   BMI:     BP:   HR: bpm  TEMP: ( )  RESP:  Physical Exam Musculoskeletal:        General: No swelling, tenderness or deformity.     Comments: Inspection reveals no atrophy of the bilateral APB or FDI or hand intrinsics. There is no swelling, color changes, allodynia or dystrophic changes. There is 5 out of 5 strength in the bilateral wrist extension, finger abduction and long finger flexion. There is intact sensation to light touch in all dermatomal and peripheral nerve distributions. There is a negative Hoffmann's test bilaterally.  Skin:    General: Skin is warm and dry.     Findings: No erythema or rash.  Neurological:     General: No focal deficit present.     Mental Status: She is alert and oriented to person, place, and time.     Motor: No weakness or abnormal muscle tone.     Coordination: Coordination normal.  Psychiatric:        Mood and Affect: Mood normal.        Behavior: Behavior normal.     Imaging: No results found.

## 2021-02-09 ENCOUNTER — Ambulatory Visit: Payer: BC Managed Care – PPO | Admitting: Orthopedic Surgery

## 2021-02-18 ENCOUNTER — Ambulatory Visit: Payer: BC Managed Care – PPO | Admitting: Orthopedic Surgery

## 2021-02-18 ENCOUNTER — Other Ambulatory Visit: Payer: Self-pay

## 2021-02-18 DIAGNOSIS — M79602 Pain in left arm: Secondary | ICD-10-CM | POA: Diagnosis not present

## 2021-02-20 ENCOUNTER — Encounter: Payer: Self-pay | Admitting: Orthopedic Surgery

## 2021-02-20 NOTE — Progress Notes (Signed)
Office Visit Note   Patient: Kelli Lewis           Date of Birth: 03/05/1981           MRN: [id number] Visit Date: 02/18/2021 Requested by: Lenard Simmer, MD [address] [place],  [place] [address] PCP: Lenard Simmer, MD  Subjective: Chief Complaint  Patient presents with   Other     Follow up LUE arm numbness-s/p EMG/NCV with Dr Ernestina Patches    HPI: Kelli Lewis is a 40 year old patient with 12-month history of left upper extremity numbness.  She says 6 months of symptoms.  Started when she was bitten by some type of flying creature on her elbow.  This was not a tick event.  Numbness and tingling comes and goes.  She is right-hand dominant.  Had a little bit of neck stiffness just before this event.  In general her sporadic arm pain has not really improved in 6 months.  She does have a history of Crohn's disease which is in remission.  Symptoms in the left arm particularly fingers 4 and 5 not dependent on neck position.  She has noticed that it is hard for her to open things.              ROS: All systems reviewed are negative as they relate to the chief complaint within the history of present illness.  Patient denies  fevers or chills.   Assessment & Plan: Visit Diagnoses:  1. Left arm pain     Plan: Impression is left upper extremity numbness and weakness with normal EMG nerve study.  The nerve study effectively ruled out ulnar nerve compression at the elbow and wrist.  Differential diagnosis at this time would be cervical spine far lateral disc herniation versus subclinical thoracic outlet obstruction versus MS.  For persistent symptoms we could consider neck MRI scan to evaluate left-sided radiculopathy.  We will schedule that for not before 1 month from now.  Patient may change her mind whether or not she wants to proceed with that intervention.  Follow-Up Instructions: No follow-ups on file.   Orders:  Orders Placed This Encounter  Procedures   MR Cervical Spine w/o  contrast   No orders of the defined types were placed in this encounter.     Procedures: No procedures performed   Clinical Data: No additional findings.  Objective: Vital Signs: There were no vitals taken for this visit.  Physical Exam:   Constitutional: Patient appears well-developed HEENT:  Head: Normocephalic Eyes:EOM are normal Neck: Normal range of motion Cardiovascular: Normal rate Pulmonary/chest: Effort normal Neurologic: Patient is alert Skin: Skin is warm Psychiatric: Patient has normal mood and affect   Ortho Exam: Ortho exam demonstrates full range of motion of the cervical spine.  She has full range of motion of wrist elbow and shoulder bilaterally.  Negative Tinel's cubital tunnel on the left.  EPL FPL interosseous strength intact bilaterally.  Radial pulse intact bilaterally.  On the left-hand side she has good filling of the hand from both the radial and ulnar artery on Allen testing.  No lymphadenopathy present in that left shoulder girdle region  Specialty Comments:  No specialty comments available.  Imaging: No results found.   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   Melanoma of skin, site unspecified 01/22/2013  IBD (inflammatory bowel disease) 02/26/2011   Past Medical History:  Diagnosis Date   Allergy    Anal fissure 01/04/2011   Anxiety    no per pt   Asthma    seasonal allergy induced does not use inhalers   Colon polyps    Colon ulcer    Crohn's disease ([place])    Diverticulosis    Duodenal diverticulum    Family history of malignant neoplasm of gastrointestinal tract    GERD (gastroesophageal reflux disease)    hx of with pregnancy   Headache    occas migraines   Heart murmur    Herpes    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, melanoma    Rectal carcinoid tumor    Vitamin B12 deficiency    Vitamin D deficiency     Family History  Problem Relation Age of Onset   Parkinsonism Father    Colon polyps Father    Irritable bowel syndrome Mother    Colon cancer Paternal Grandmother    Breast cancer Paternal Grandmother    Crohn's disease Paternal Grandmother    Esophageal cancer Neg Hx    Stomach cancer Neg Hx    Prostate cancer Neg Hx     Past Surgical History:  Procedure Laterality Date   COLONOSCOPY     EUS N/A 05/02/2014   Procedure: LOWER ENDOSCOPIC ULTRASOUND (EUS);  Surgeon: Milus Banister, MD;  Location: [facility];  Service: Endoscopy;  Laterality: N/A;   MELANOMA EXCISION     PARTIAL PROCTECTOMY BY TEM N/A 05/23/2014   Procedure: TEM PARTIAL PROCTECTOMY OF RECTAL MASS;  Surgeon: Michael Boston, MD;  Location: [facility];  Service: General;  Laterality: N/A;   WISDOM TOOTH EXTRACTION     Social History   Occupational History   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  Tobacco Use   Smoking status: Never   Smokeless tobacco: Never  Vaping Use   Vaping Use: Never used  Substance and Sexual Activity   Alcohol use: Yes    Comment: once monthly - beer   Drug use: No   Sexual activity: Yes    Birth control/protection: I.U.D.

## 2021-03-30 ENCOUNTER — Other Ambulatory Visit: Payer: Self-pay

## 2021-03-30 ENCOUNTER — Ambulatory Visit
Admission: RE | Admit: 2021-03-30 | Discharge: 2021-03-30 | Disposition: A | Discharge status: Home / Self Care | Payer: BC Managed Care – PPO | Source: Ambulatory Visit | Attending: Orthopedic Surgery | Admitting: Orthopedic Surgery

## 2021-03-30 DIAGNOSIS — M79602 Pain in left arm: Secondary | ICD-10-CM

## 2021-04-03 ENCOUNTER — Telehealth: Payer: Self-pay

## 2021-04-03 NOTE — Telephone Encounter (Signed)
Patient asking for MRI results due to MRI follow up appt being scheduled so far out. Please advise. [phone]

## 2021-04-06 ENCOUNTER — Ambulatory Visit: Payer: BC Managed Care – PPO | Admitting: Surgical

## 2021-04-06 ENCOUNTER — Other Ambulatory Visit: Payer: Self-pay

## 2021-04-06 ENCOUNTER — Encounter: Payer: Self-pay | Admitting: Orthopedic Surgery

## 2021-04-06 DIAGNOSIS — M79602 Pain in left arm: Secondary | ICD-10-CM

## 2021-04-06 DIAGNOSIS — R200 Anesthesia of skin: Secondary | ICD-10-CM | POA: Diagnosis not present

## 2021-04-06 MED ORDER — TRAMADOL HCL 50 MG PO TABS: 50.0000 mg | ORAL_TABLET | Freq: Every evening | ORAL | 0 refills | Status: DC | PRN

## 2021-04-06 NOTE — Progress Notes (Signed)
Office Visit Note   Patient: Kelli Lewis           Date of Birth: Sep 23, 1980           MRN: [id number] Visit Date: 04/06/2021 Requested by: Lenard Simmer, MD [address] [place],  [place] [address] PCP: Lenard Simmer, MD  Subjective: Chief Complaint  Patient presents with   Other    Scan review    HPI: Marlaya Turck is a 40 y.o. female who presents to the office for MRI review. Continues to complain mainly of left arm numbness and tingling and pain radiating down to her fingers, primarily fourth and fifth fingers.  She also has started to notice increased neck pain in the last 1 to 2 weeks that was not present for several months of her symptoms.  Her symptoms are keeping her up more at night.  MRI results revealed: MR Cervical Spine w/o contrast  Result Date: 03/30/2021 CLINICAL DATA:  Provided history: Left arm pain. Evaluate for source of left radicular arm pain. Additional history provided by scanning technologist: Patient reports neck pain radiating into left arm with numbness of left hand and loss of strength for 4 months. Patient reports a history of skin cancer in 2003. EXAM: MRI CERVICAL SPINE WITHOUT CONTRAST TECHNIQUE: Multiplanar, multisequence MR imaging of the cervical spine was performed. No intravenous contrast was administered. COMPARISON:  None. FINDINGS: Mild intermittent motion degradation. Alignment: Cervical levocurvature. No significant spondylolisthesis. Vertebrae: Vertebral body height is maintained. Trace degenerative endplate edema at B7-J6. Elsewhere, no significant marrow edema or focal suspicious osseous lesion is identified. Cord: No signal abnormality identified within the cervical spinal cord. Posterior Fossa, vertebral arteries, paraspinal tissues: No abnormality identified within included portions of the posterior fossa. Flow voids preserved within the imaged cervical vertebral arteries. Paraspinal soft tissues unremarkable. Disc levels: Mild  multilevel disc degeneration, greatest at C5-C6. C2-C3: Slight disc bulge. No significant spinal canal or foraminal stenosis. C3-C4: Slight disc bulge. No significant spinal canal or foraminal stenosis. C4-C5: Mild disc bulge slightly asymmetric to the left. Mild bilateral uncovertebral hypertrophy. Mild partial effacement of the ventral thecal sac (without spinal cord mass effect). Minimal relative left neural foraminal narrowing. C5-C6: Disc bulge. Post broad-based left center/foraminal disc protrusion. Associated left-sided disc osteophyte ridge/uncinate hypertrophy. Disc osteophyte ridge/uncovertebral hypertrophy also present on the right. The disc bulge and disc protrusion mildly narrow the spinal canal, contacting the ventral spinal cord. The left center/foraminal disc protrusion contributes to moderate left neural foraminal narrowing, and may affect the exiting left C6 nerve root. Mild right neural foraminal narrowing. C6-C7: Slight disc bulge. No significant spinal canal or foraminal stenosis. C7-T1: No significant disc herniation or stenosis. IMPRESSION: Cervical spondylosis, as outlined and with findings most notably as follows. At C5-C6, there is a disc bulge with superimposed broad-based left center/foraminal disc protrusion. Associated left-sided disc osteophyte ridge/uncinate hypertrophy. Disc osteophyte ridge/uncinate hypertrophy also present on the right. The disc bulge and disc protrusion mildly narrow the spinal canal, contacting the ventral spinal cord. Additionally, the disc protrusion contributes to moderate left neural foraminal narrowing, and may affect the exiting left C6 nerve root. Correlate for left C6 radiculopathy. Mild right neural foraminal narrowing also present at this level. At C4-C5, there is a disc bulge which is slightly asymmetric to the left. Mild bilateral uncovertebral hypertrophy. Mild partial effacement of the ventral thecal sac (without spinal cord mass effect). Minimal  relative left neural foraminal narrowing. No significant spinal canal or foraminal stenosis  at the remaining levels. Mild multilevel disc degeneration, greatest at C5-C6. Electronically Signed   By: Kellie Simmering D.O.   On: 03/30/2021 17:10                 ROS: All systems reviewed are negative as they relate to the chief complaint within the history of present illness.  Patient denies fevers or chills.  Assessment & Plan: Visit Diagnoses:  1. Left upper extremity numbness     Plan: [name] is a 40 y.o. female who presents to the office for MRI review of cervical spine.  MRI results as detailed above in the HPI.  She is having continued left arm radicular symptoms with numbness and tingling as well as new neck pain since her last visit.  Discussed the options available to patient.  She would like to try cervical spine ESI.  Refer to Dr. Ernestina Patches for ESI.  Also prescribed one-time prescription of tramadol to give her some relief and help with sleeping in the meantime until she gets in with Dr. Ernestina Patches.  Patient agreed with plan.  She will call the office if she has limited relief from her injection or if she would like to try physical therapy for her neck in addition to the injection.  She denies taking any blood thinners.  Follow-Up Instructions: Return if symptoms worsen or fail to improve.   Orders:  No orders of the defined types were placed in this encounter.  Meds ordered this encounter  Medications   traMADol (ULTRAM) 50 MG tablet    Sig: Take 1 tablet (50 mg total) by mouth at bedtime as needed.    Dispense:  10 tablet    Refill:  0      Procedures: No procedures performed   Clinical Data: No additional findings.  Objective: Vital Signs: There were no vitals taken for this visit.  Physical Exam:  Constitutional: Patient appears well-developed HEENT:  Head: Normocephalic Eyes:EOM are normal Neck: Normal range of motion Cardiovascular: Normal rate Pulmonary/chest:  Effort normal Neurologic: Patient is alert Skin: Skin is warm Psychiatric: Patient has normal mood and affect  Ortho Exam: Ortho exam demonstrates left arm with 2+ radial pulse.  5/5 motor strength of left grip strength, finger abduction, EPL, bicep flexion, tricep extension, deltoid.  Specialty Comments:  No specialty comments available.  Imaging: No results found.   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   Melanoma of skin, site unspecified 01/22/2013   IBD (inflammatory bowel disease) 02/26/2011   Past Medical History:  Diagnosis Date   Allergy    Anal fissure 01/04/2011   Anxiety    no per pt   Asthma    seasonal allergy induced does not use inhalers   Colon polyps    Colon ulcer    Crohn's disease ([place])    Diverticulosis    Duodenal diverticulum    Family history of malignant neoplasm of gastrointestinal tract    GERD (gastroesophageal reflux disease)    hx of with pregnancy   Headache    occas migraines   Heart murmur    Herpes    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 2002-2003, melanoma   Rectal carcinoid tumor    Vitamin B12 deficiency    Vitamin D deficiency     Family History  Problem Relation Age of Onset   Parkinsonism Father    Colon polyps Father    Irritable bowel syndrome Mother    Colon cancer Paternal Grandmother    Breast cancer Paternal Grandmother    Crohn's disease Paternal Grandmother    Esophageal cancer Neg Hx    Stomach cancer Neg Hx    Prostate cancer Neg Hx     Past Surgical History:  Procedure Laterality Date   COLONOSCOPY     EUS N/A 05/02/2014   Procedure: LOWER ENDOSCOPIC ULTRASOUND (EUS);  Surgeon: Milus Banister, MD;  Location: [facility];  Service: Endoscopy;  Laterality: N/A;   MELANOMA EXCISION     PARTIAL PROCTECTOMY BY TEM N/A 05/23/2014    Procedure: TEM PARTIAL PROCTECTOMY OF RECTAL MASS;  Surgeon: Michael Boston, MD;  Location: [facility];  Service: General;  Laterality: N/A;   WISDOM TOOTH EXTRACTION     Social History   Occupational History   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  Tobacco Use   Smoking status: Never   Smokeless tobacco: Never  Vaping Use   Vaping Use: Never used  Substance and Sexual Activity   Alcohol use: Yes    Comment: once monthly - beer   Drug use: No   Sexual activity: Yes    Birth control/protection: I.U.D.

## 2021-04-30 ENCOUNTER — Ambulatory Visit: Payer: BC Managed Care – PPO | Admitting: Orthopedic Surgery

## 2021-05-04 ENCOUNTER — Other Ambulatory Visit: Payer: Self-pay | Admitting: Obstetrics and Gynecology

## 2021-05-04 DIAGNOSIS — Z803 Family history of malignant neoplasm of breast: Secondary | ICD-10-CM

## 2021-05-05 ENCOUNTER — Ambulatory Visit: Payer: Self-pay

## 2021-05-05 ENCOUNTER — Ambulatory Visit: Payer: BC Managed Care – PPO | Admitting: Physical Medicine and Rehabilitation

## 2021-05-05 ENCOUNTER — Encounter: Payer: Self-pay | Admitting: Physical Medicine and Rehabilitation

## 2021-05-05 ENCOUNTER — Other Ambulatory Visit: Payer: Self-pay

## 2021-05-05 VITALS — BP 128/87 | HR 91

## 2021-05-05 DIAGNOSIS — M5412 Radiculopathy, cervical region: Secondary | ICD-10-CM

## 2021-05-05 MED ORDER — METHYLPREDNISOLONE ACETATE 80 MG/ML IJ SUSP
80.0000 mg | Freq: Once | INTRAMUSCULAR | Status: AC
  Administered 2021-05-05: 80 mg

## 2021-05-05 NOTE — Progress Notes (Signed)
Pt state neck pain that travels to her left arm. Pt state any movement makes the pain worse. Pt state she takes pain meds to help ease her pain.  Numeric Pain Rating Scale and Functional Assessment Average Pain 3   In the last MONTH (on 0-10 scale) has pain interfered with the following?  1. General activity like being  able to carry out your everyday physical activities such as walking, climbing stairs, carrying groceries, or moving a chair?  Rating(6)   +Driver, -BT, -Dye Allergies.

## 2021-05-05 NOTE — Patient Instructions (Signed)

## 2021-05-17 ENCOUNTER — Other Ambulatory Visit: Payer: BC Managed Care – PPO

## 2021-05-18 ENCOUNTER — Other Ambulatory Visit: Payer: Self-pay | Admitting: Obstetrics and Gynecology

## 2021-05-18 DIAGNOSIS — N6320 Unspecified lump in the left breast, unspecified quadrant: Secondary | ICD-10-CM

## 2021-05-30 ENCOUNTER — Inpatient Hospital Stay: Admission: RE | Admit: 2021-05-30 | Payer: BC Managed Care – PPO | Source: Ambulatory Visit

## 2021-05-30 NOTE — Procedures (Signed)
Cervical Epidural Steroid Injection - Interlaminar Approach with Fluoroscopic Guidance  Patient: Kelli Lewis      Date of Birth: Nov 27, 1980 MRN: [id number] PCP: Lenard Simmer, MD      Visit Date: 05/05/2021   Universal Protocol:    Date/Time: 02/04/236:56 AM  Consent Given By: the patient  Position: PRONE  Additional Comments: Vital signs were monitored before and after the procedure. Patient was prepped and draped in the usual sterile fashion. The correct patient, procedure, and site was verified.   Injection Procedure Details:   Procedure diagnoses: Cervical radiculopathy [M54.12]    Meds Administered:  Meds ordered this encounter  Medications   methylPREDNISolone acetate (DEPO-MEDROL) injection 80 mg     Laterality: Left  Location/Site: C7-T1  Needle: 3.5 in., 20 ga. Tuohy  Needle Placement: Paramedian epidural space  Findings:  -Comments: Excellent flow of contrast into the epidural space.  Procedure Details: Using a paramedian approach from the side mentioned above, the region overlying the inferior lamina was localized under fluoroscopic visualization and the soft tissues overlying this structure were infiltrated with 4 ml. of 1% Lidocaine without Epinephrine. A # 20 gauge, Tuohy needle was inserted into the epidural space using a paramedian approach.  The epidural space was localized using loss of resistance along with contralateral oblique bi-planar fluoroscopic views.  After negative aspirate for air, blood, and CSF, a 2 ml. volume of Isovue-250 was injected into the epidural space and the flow of contrast was observed. Radiographs were obtained for documentation purposes.   The injectate was administered into the level noted above.  Additional Comments:  No complications occurred Dressing: 2 x 2 sterile gauze and Band-Aid    Post-procedure details: Patient was observed during the procedure. Post-procedure instructions were reviewed.  Patient  left the clinic in stable condition.

## 2021-05-30 NOTE — Progress Notes (Signed)
[facility] - 41 y.o. female MRN [id number]  Date of birth: 1980-10-21  Office Visit Note: Visit Date: 05/05/2021 PCP: Lenard Simmer, MD Referred by: Lenard Simmer, MD  Subjective: Chief Complaint  Patient presents with   Neck - Pain   Left Arm - Pain   HPI:  Kelli Lewis is a 41 y.o. female who comes in today at the request of Dr. Anderson Malta for planned Left C7-T1 Cervical Interlaminar epidural steroid injection with fluoroscopic guidance.  The patient has failed conservative care including home exercise, medications, time and activity modification.  This injection will be diagnostic and hopefully therapeutic.  Please see requesting physician notes for further details and justification.   ROS Otherwise per HPI.  Assessment & Plan: Visit Diagnoses:    ICD-10-CM   1. Cervical radiculopathy  M54.12 XR C-ARM NO REPORT    Epidural Steroid injection    methylPREDNISolone acetate (DEPO-MEDROL) injection 80 mg      Plan: No additional findings.   Meds & Orders:  Meds ordered this encounter  Medications   methylPREDNISolone acetate (DEPO-MEDROL) injection 80 mg    Orders Placed This Encounter  Procedures   XR C-ARM NO REPORT   Epidural Steroid injection    Follow-up: Return for visit to requesting provider as needed.   Procedures: No procedures performed  Cervical Epidural Steroid Injection - Interlaminar Approach with Fluoroscopic Guidance  Patient: Kelli Lewis      Date of Birth: 1980-06-08 MRN: [id number] PCP: Lenard Simmer, MD      Visit Date: 05/05/2021   Universal Protocol:    Date/Time: 02/04/236:56 AM  Consent Given By: the patient  Position: PRONE  Additional Comments: Vital signs were monitored before and after the procedure. Patient was prepped and draped in the usual sterile fashion. The correct patient, procedure, and site was verified.   Injection Procedure Details:   Procedure diagnoses: Cervical radiculopathy  [M54.12]    Meds Administered:  Meds ordered this encounter  Medications   methylPREDNISolone acetate (DEPO-MEDROL) injection 80 mg     Laterality: Left  Location/Site: C7-T1  Needle: 3.5 in., 20 ga. Tuohy  Needle Placement: Paramedian epidural space  Findings:  -Comments: Excellent flow of contrast into the epidural space.  Procedure Details: Using a paramedian approach from the side mentioned above, the region overlying the inferior lamina was localized under fluoroscopic visualization and the soft tissues overlying this structure were infiltrated with 4 ml. of 1% Lidocaine without Epinephrine. A # 20 gauge, Tuohy needle was inserted into the epidural space using a paramedian approach.  The epidural space was localized using loss of resistance along with contralateral oblique bi-planar fluoroscopic views.  After negative aspirate for air, blood, and CSF, a 2 ml. volume of Isovue-250 was injected into the epidural space and the flow of contrast was observed. Radiographs were obtained for documentation purposes.   The injectate was administered into the level noted above.  Additional Comments:  No complications occurred Dressing: 2 x 2 sterile gauze and Band-Aid    Post-procedure details: Patient was observed during the procedure. Post-procedure instructions were reviewed.  Patient left the clinic in stable condition.   Clinical History: MRI CERVICAL SPINE WITHOUT CONTRAST   TECHNIQUE: Multiplanar, multisequence MR imaging of the cervical spine was performed. No intravenous contrast was administered.   COMPARISON:  None.   FINDINGS: Mild intermittent motion degradation.   Alignment: Cervical levocurvature. No significant spondylolisthesis.   Vertebrae: Vertebral body height is maintained. Trace  degenerative endplate edema at J5-K0. Elsewhere, no significant marrow edema or focal suspicious osseous lesion is identified.   Cord: No signal abnormality identified  within the cervical spinal cord.   Posterior Fossa, vertebral arteries, paraspinal tissues: No abnormality identified within included portions of the posterior fossa. Flow voids preserved within the imaged cervical vertebral arteries. Paraspinal soft tissues unremarkable.   Disc levels:   Mild multilevel disc degeneration, greatest at C5-C6.   C2-C3: Slight disc bulge. No significant spinal canal or foraminal stenosis.   C3-C4: Slight disc bulge. No significant spinal canal or foraminal stenosis.   C4-C5: Mild disc bulge slightly asymmetric to the left. Mild bilateral uncovertebral hypertrophy. Mild partial effacement of the ventral thecal sac (without spinal cord mass effect). Minimal relative left neural foraminal narrowing.   C5-C6: Disc bulge. Post broad-based left center/foraminal disc protrusion. Associated left-sided disc osteophyte ridge/uncinate hypertrophy. Disc osteophyte ridge/uncovertebral hypertrophy also present on the right. The disc bulge and disc protrusion mildly narrow the spinal canal, contacting the ventral spinal cord. The left center/foraminal disc protrusion contributes to moderate left neural foraminal narrowing, and may affect the exiting left C6 nerve root. Mild right neural foraminal narrowing.   C6-C7: Slight disc bulge. No significant spinal canal or foraminal stenosis.   C7-T1: No significant disc herniation or stenosis.   IMPRESSION: Cervical spondylosis, as outlined and with findings most notably as follows.   At C5-C6, there is a disc bulge with superimposed broad-based left center/foraminal disc protrusion. Associated left-sided disc osteophyte ridge/uncinate hypertrophy. Disc osteophyte ridge/uncinate hypertrophy also present on the right. The disc bulge and disc protrusion mildly narrow the spinal canal, contacting the ventral spinal cord. Additionally, the disc protrusion contributes to moderate left neural foraminal narrowing, and  may affect the exiting left C6 nerve root. Correlate for left C6 radiculopathy. Mild right neural foraminal narrowing also present at this level.   At C4-C5, there is a disc bulge which is slightly asymmetric to the left. Mild bilateral uncovertebral hypertrophy. Mild partial effacement of the ventral thecal sac (without spinal cord mass effect). Minimal relative left neural foraminal narrowing.   No significant spinal canal or foraminal stenosis at the remaining levels.   Mild multilevel disc degeneration, greatest at C5-C6.     Electronically Signed   By: Kellie Simmering D.O.   On: 03/30/2021 17:10     Objective:  VS:  HT:     WT:    BMI:      BP:128/87   HR:91bpm   TEMP: ( )   RESP:  Physical Exam Vitals and nursing note reviewed.  Constitutional:      General: She is not in acute distress.    Appearance: Normal appearance. She is not ill-appearing.  HENT:     Head: Normocephalic and atraumatic.     Right Ear: External ear normal.     Left Ear: External ear normal.  Eyes:     Extraocular Movements: Extraocular movements intact.  Cardiovascular:     Rate and Rhythm: Normal rate.     Pulses: Normal pulses.  Musculoskeletal:     Cervical back: Tenderness present. No rigidity.     Right lower leg: No edema.     Left lower leg: No edema.     Comments: Patient has good strength in the upper extremities including 5 out of 5 strength in wrist extension long finger flexion and APB.  There is no atrophy of the hands intrinsically.  There is a negative Hoffmann's test.   Lymphadenopathy:  Cervical: No cervical adenopathy.  Skin:    Findings: No erythema, lesion or rash.  Neurological:     General: No focal deficit present.     Mental Status: She is alert and oriented to person, place, and time.     Sensory: No sensory deficit.     Motor: No weakness or abnormal muscle tone.     Coordination: Coordination normal.  Psychiatric:        Mood and Affect: Mood normal.         Behavior: Behavior normal.     Imaging: No results found.

## 2021-06-13 ENCOUNTER — Ambulatory Visit
Admission: RE | Admit: 2021-06-13 | Discharge: 2021-06-13 | Disposition: A | Discharge status: Home / Self Care | Payer: BC Managed Care – PPO | Source: Ambulatory Visit | Attending: Obstetrics and Gynecology | Admitting: Obstetrics and Gynecology

## 2021-06-13 ENCOUNTER — Other Ambulatory Visit: Payer: Self-pay

## 2021-06-13 DIAGNOSIS — Z803 Family history of malignant neoplasm of breast: Secondary | ICD-10-CM

## 2021-06-13 MED ORDER — GADOBUTROL 1 MMOL/ML IV SOLN
7.0000 mL | Freq: Once | INTRAVENOUS | Status: AC | PRN
  Administered 2021-06-13: 7 mL via INTRAVENOUS

## 2021-07-08 ENCOUNTER — Telehealth: Payer: Self-pay | Admitting: Physical Medicine and Rehabilitation

## 2021-07-08 NOTE — Telephone Encounter (Signed)
Pt requesting a call back to set an appt. Pt is an already pt of Dr. Ernestina Patches. Please call pt at [phone]. ?

## 2021-08-10 ENCOUNTER — Encounter: Payer: Self-pay | Admitting: Physical Medicine and Rehabilitation

## 2021-08-10 ENCOUNTER — Ambulatory Visit: Payer: Self-pay

## 2021-08-10 ENCOUNTER — Ambulatory Visit: Payer: BC Managed Care – PPO | Admitting: Physical Medicine and Rehabilitation

## 2021-08-10 DIAGNOSIS — M5412 Radiculopathy, cervical region: Secondary | ICD-10-CM | POA: Diagnosis not present

## 2021-08-10 MED ORDER — METHYLPREDNISOLONE ACETATE 80 MG/ML IJ SUSP
80.0000 mg | Freq: Once | INTRAMUSCULAR | Status: AC
  Administered 2021-08-10: 80 mg

## 2021-08-10 NOTE — Progress Notes (Signed)
Pt state neck pain. Pt state any movement makes the pain worse. Pt state she takes pain meds to help ease her pain. ? ?Numeric Pain Rating Scale and Functional Assessment ?Average Pain 4 ? ? ?In the last MONTH (on 0-10 scale) has pain interfered with the following? ? ?1. General activity like being  able to carry out your everyday physical activities such as walking, climbing stairs, carrying groceries, or moving a chair?  ?Rating(8) ? ? ?+Driver, -BT, -Dye Allergies. ? ?

## 2021-08-10 NOTE — Patient Instructions (Signed)

## 2021-08-19 NOTE — Procedures (Signed)
Cervical Epidural Steroid Injection - Interlaminar Approach with Fluoroscopic Guidance ? ?Patient: Kelli Lewis      ?Date of Birth: 1980/10/08 ?MRN: [id number] ?PCP: Lenard Simmer, MD      ?Visit Date: 08/10/2021 ?  ?Universal Protocol:    ?Date/Time: 04/26/235:29 AM ? ?Consent Given By: the patient ? ?Position: PRONE ? ?Additional Comments: ?Vital signs were monitored before and after the procedure. ?Patient was prepped and draped in the usual sterile fashion. ?The correct patient, procedure, and site was verified. ? ? ?Injection Procedure Details:  ? ?Procedure diagnoses: Cervical radiculopathy [M54.12]   ? ?Meds Administered:  ?Meds ordered this encounter  ?Medications  ? methylPREDNISolone acetate (DEPO-MEDROL) injection 80 mg  ?  ? ?Laterality: Left ? ?Location/Site: C7-T1 ? ?Needle: 3.5 in., 20 ga. Tuohy ? ?Needle Placement: Paramedian epidural space ? ?Findings: ? -Comments: Excellent flow of contrast into the epidural space. ? ?Procedure Details: ?Using a paramedian approach from the side mentioned above, the region overlying the inferior lamina was localized under fluoroscopic visualization and the soft tissues overlying this structure were infiltrated with 4 ml. of 1% Lidocaine without Epinephrine. A # 20 gauge, Tuohy needle was inserted into the epidural space using a paramedian approach. ? ?The epidural space was localized using loss of resistance along with contralateral oblique bi-planar fluoroscopic views.  After negative aspirate for air, blood, and CSF, a 2 ml. volume of Isovue-250 was injected into the epidural space and the flow of contrast was observed. Radiographs were obtained for documentation purposes.  ? ?The injectate was administered into the level noted above. ? ?Additional Comments:  ?No complications occurred ?Dressing: 2 x 2 sterile gauze and Band-Aid ?  ? ?Post-procedure details: ?Patient was observed during the procedure. ?Post-procedure instructions were reviewed. ? ?Patient  left the clinic in stable condition. ?

## 2021-08-19 NOTE — Progress Notes (Signed)
? ?Kelli Lewis - 41 y.o. female MRN [id number]  Date of birth: 05-Aug-1980 ? ?Office Visit Note: ?Visit Date: 08/10/2021 ?PCP: Lenard Simmer, MD ?Referred by: Lenard Simmer, MD ? ?Subjective: ?Chief Complaint  ?Patient presents with  ? Neck - Pain  ? ?HPI:  Kelli Lewis is a 41 y.o. female who comes in today for planned repeat Left C7-T1  Cervical Interlaminar epidural steroid injection with fluoroscopic guidance.  The patient has failed conservative care including home exercise, medications, time and activity modification.  This injection will be diagnostic and hopefully therapeutic.  Please see requesting physician notes for further details and justification. Patient received more than 50% pain relief from prior injection.  ? ?Referring: Dr. Anderson Malta and [facility], PA-C ? ?ROS Otherwise per HPI. ? ?Assessment & Plan: ?Visit Diagnoses:  ?  ICD-10-CM   ?1. Cervical radiculopathy  M54.12 XR C-ARM NO REPORT  ?  Epidural Steroid injection  ?  methylPREDNISolone acetate (DEPO-MEDROL) injection 80 mg  ?  ?  ?Plan: No additional findings.  ? ?Meds & Orders:  ?Meds ordered this encounter  ?Medications  ? methylPREDNISolone acetate (DEPO-MEDROL) injection 80 mg  ?  ?Orders Placed This Encounter  ?Procedures  ? XR C-ARM NO REPORT  ? Epidural Steroid injection  ?  ?Follow-up: Return for visit to requesting provider as needed.  ? ?Procedures: ?No procedures performed  ?Cervical Epidural Steroid Injection - Interlaminar Approach with Fluoroscopic Guidance ? ?Patient: Kelli Lewis      ?Date of Birth: 02/12/1981 ?MRN: [id number] ?PCP: Lenard Simmer, MD      ?Visit Date: 08/10/2021 ?  ?Universal Protocol:    ?Date/Time: 04/26/235:29 AM ? ?Consent Given By: the patient ? ?Position: PRONE ? ?Additional Comments: ?Vital signs were monitored before and after the procedure. ?Patient was prepped and draped in the usual sterile fashion. ?The correct patient, procedure, and site was verified. ? ? ?Injection  Procedure Details:  ? ?Procedure diagnoses: Cervical radiculopathy [M54.12]   ? ?Meds Administered:  ?Meds ordered this encounter  ?Medications  ? methylPREDNISolone acetate (DEPO-MEDROL) injection 80 mg  ?  ? ?Laterality: Left ? ?Location/Site: C7-T1 ? ?Needle: 3.5 in., 20 ga. Tuohy ? ?Needle Placement: Paramedian epidural space ? ?Findings: ? -Comments: Excellent flow of contrast into the epidural space. ? ?Procedure Details: ?Using a paramedian approach from the side mentioned above, the region overlying the inferior lamina was localized under fluoroscopic visualization and the soft tissues overlying this structure were infiltrated with 4 ml. of 1% Lidocaine without Epinephrine. A # 20 gauge, Tuohy needle was inserted into the epidural space using a paramedian approach. ? ?The epidural space was localized using loss of resistance along with contralateral oblique bi-planar fluoroscopic views.  After negative aspirate for air, blood, and CSF, a 2 ml. volume of Isovue-250 was injected into the epidural space and the flow of contrast was observed. Radiographs were obtained for documentation purposes.  ? ?The injectate was administered into the level noted above. ? ?Additional Comments:  ?No complications occurred ?Dressing: 2 x 2 sterile gauze and Band-Aid ?  ? ?Post-procedure details: ?Patient was observed during the procedure. ?Post-procedure instructions were reviewed. ? ?Patient left the clinic in stable condition.  ? ?Clinical History: ?MRI CERVICAL SPINE WITHOUT CONTRAST ?  ?TECHNIQUE: ?Multiplanar, multisequence MR imaging of the cervical spine was ?performed. No intravenous contrast was administered. ?  ?COMPARISON:  None. ?  ?FINDINGS: ?Mild intermittent motion degradation. ?  ?Alignment: Cervical levocurvature. No significant spondylolisthesis. ?  ?  Vertebrae: Vertebral body height is maintained. Trace degenerative ?endplate edema at B2-I2. Elsewhere, no significant marrow edema or ?focal suspicious osseous  lesion is identified. ?  ?Cord: No signal abnormality identified within the cervical spinal ?cord. ?  ?Posterior Fossa, vertebral arteries, paraspinal tissues: No ?abnormality identified within included portions of the posterior ?fossa. Flow voids preserved within the imaged cervical vertebral ?arteries. Paraspinal soft tissues unremarkable. ?  ?Disc levels: ?  ?Mild multilevel disc degeneration, greatest at C5-C6. ?  ?C2-C3: Slight disc bulge. No significant spinal canal or foraminal ?stenosis. ?  ?C3-C4: Slight disc bulge. No significant spinal canal or foraminal ?stenosis. ?  ?C4-C5: Mild disc bulge slightly asymmetric to the left. Mild ?bilateral uncovertebral hypertrophy. Mild partial effacement of the ?ventral thecal sac (without spinal cord mass effect). Minimal ?relative left neural foraminal narrowing. ?  ?C5-C6: Disc bulge. Post broad-based left center/foraminal disc ?protrusion. Associated left-sided disc osteophyte ridge/uncinate ?hypertrophy. Disc osteophyte ridge/uncovertebral hypertrophy also ?present on the right. The disc bulge and disc protrusion mildly ?narrow the spinal canal, contacting the ventral spinal cord. The ?left center/foraminal disc protrusion contributes to moderate left ?neural foraminal narrowing, and may affect the exiting left C6 nerve ?root. Mild right neural foraminal narrowing. ?  ?C6-C7: Slight disc bulge. No significant spinal canal or foraminal ?stenosis. ?  ?C7-T1: No significant disc herniation or stenosis. ?  ?IMPRESSION: ?Cervical spondylosis, as outlined and with findings most notably as ?follows. ?  ?At C5-C6, there is a disc bulge with superimposed broad-based left ?center/foraminal disc protrusion. Associated left-sided disc ?osteophyte ridge/uncinate hypertrophy. Disc osteophyte ?ridge/uncinate hypertrophy also present on the right. The disc bulge ?and disc protrusion mildly narrow the spinal canal, contacting the ?ventral spinal cord. Additionally, the disc  protrusion contributes ?to moderate left neural foraminal narrowing, and may affect the ?exiting left C6 nerve root. Correlate for left C6 radiculopathy. ?Mild right neural foraminal narrowing also present at this level. ?  ?At C4-C5, there is a disc bulge which is slightly asymmetric to the ?left. Mild bilateral uncovertebral hypertrophy. Mild partial ?effacement of the ventral thecal sac (without spinal cord mass ?effect). Minimal relative left neural foraminal narrowing. ?  ?No significant spinal canal or foraminal stenosis at the remaining ?levels. ?  ?Mild multilevel disc degeneration, greatest at C5-C6. ?  ?  ?Electronically Signed ?  By: Kellie Simmering D.O. ?  On: 03/30/2021 17:10  ? ? ? ?Objective:  VS:  HT:    WT:   BMI:     BP:   HR: bpm  TEMP: ( )  RESP:  ?Physical Exam ?Vitals and nursing note reviewed.  ?Constitutional:   ?   General: She is not in acute distress. ?   Appearance: Normal appearance. She is not ill-appearing.  ?HENT:  ?   Head: Normocephalic and atraumatic.  ?   Right Ear: External ear normal.  ?   Left Ear: External ear normal.  ?Eyes:  ?   Extraocular Movements: Extraocular movements intact.  ?Cardiovascular:  ?   Rate and Rhythm: Normal rate.  ?   Pulses: Normal pulses.  ?Musculoskeletal:  ?   Cervical back: Tenderness present. No rigidity.  ?   Right lower leg: No edema.  ?   Left lower leg: No edema.  ?   Comments: Patient has good strength in the upper extremities including 5 out of 5 strength in wrist extension long finger flexion and APB.  There is no atrophy of the hands intrinsically.  There is a negative Hoffmann's test. ?  ?  Lymphadenopathy:  ?   Cervical: No cervical adenopathy.  ?Skin: ?   Findings: No erythema, lesion or rash.  ?Neurological:  ?   General: No focal deficit present.  ?   Mental Status: She is alert and oriented to person, place, and time.  ?   Sensory: No sensory deficit.  ?   Motor: No weakness or abnormal muscle tone.  ?   Coordination: Coordination  normal.  ?Psychiatric:     ?   Mood and Affect: Mood normal.     ?   Behavior: Behavior normal.  ?  ? ?Imaging: ?No results found. ?

## 2021-11-20 ENCOUNTER — Telehealth: Payer: Self-pay | Admitting: Physical Medicine and Rehabilitation

## 2021-11-20 NOTE — Telephone Encounter (Signed)
Patient called. She would like an injection. Her call back number is [phone]

## 2021-12-07 ENCOUNTER — Other Ambulatory Visit: Payer: Self-pay | Admitting: Internal Medicine

## 2021-12-07 DIAGNOSIS — Z8249 Family history of ischemic heart disease and other diseases of the circulatory system: Secondary | ICD-10-CM

## 2021-12-07 DIAGNOSIS — Z0000 Encounter for general adult medical examination without abnormal findings: Secondary | ICD-10-CM

## 2021-12-08 ENCOUNTER — Other Ambulatory Visit: Payer: BC Managed Care – PPO

## 2021-12-14 ENCOUNTER — Encounter: Payer: Self-pay | Admitting: Physical Medicine and Rehabilitation

## 2021-12-14 ENCOUNTER — Ambulatory Visit: Payer: BC Managed Care – PPO | Admitting: Physical Medicine and Rehabilitation

## 2021-12-14 ENCOUNTER — Ambulatory Visit: Payer: Self-pay

## 2021-12-14 VITALS — BP 137/86 | HR 81

## 2021-12-14 DIAGNOSIS — M5412 Radiculopathy, cervical region: Secondary | ICD-10-CM | POA: Diagnosis not present

## 2021-12-14 MED ORDER — METHYLPREDNISOLONE ACETATE 80 MG/ML IJ SUSP
40.0000 mg | Freq: Once | INTRAMUSCULAR | Status: AC
  Administered 2021-12-14: 40 mg

## 2021-12-14 NOTE — Progress Notes (Signed)
Pt state neck pain. Pt state her pain cause her headaches. Pt state any movement makes the pain worse. Pt state she takes pain meds to help ease her pain.  Numeric Pain Rating Scale and Functional Assessment Average Pain 2   In the last MONTH (on 0-10 scale) has pain interfered with the following?  1. General activity like being  able to carry out your everyday physical activities such as walking, climbing stairs, carrying groceries, or moving a chair?  Rating(7)   +Driver, -BT, -Dye Allergies.

## 2021-12-14 NOTE — Patient Instructions (Signed)

## 2021-12-23 NOTE — Progress Notes (Signed)
[facility] [place] - 41 y.o. female MRN [id number]  Date of birth: 10-24-1980  Office Visit Note: Visit Date: 12/14/2021 PCP: Lenard Simmer, MD Referred by: Lenard Simmer, MD  Subjective: Chief Complaint  Patient presents with   Neck - Pain   HPI:  Kelli Lewis is a 41 y.o. female who comes in today for planned repeat Left C7-T1  Cervical Interlaminar epidural steroid injection with fluoroscopic guidance.  The patient has failed conservative care including home exercise, medications, time and activity modification.  This injection will be diagnostic and hopefully therapeutic.  Please see requesting physician notes for further details and justification. Patient received more than 50% pain relief from prior injection.   Referring: Annie Main, PA-C   ROS Otherwise per HPI.  Assessment & Plan: Visit Diagnoses:    ICD-10-CM   1. Cervical radiculopathy  M54.12 XR C-ARM NO REPORT    Epidural Steroid injection    methylPREDNISolone acetate (DEPO-MEDROL) injection 40 mg      Plan: No additional findings.   Meds & Orders:  Meds ordered this encounter  Medications   methylPREDNISolone acetate (DEPO-MEDROL) injection 40 mg    Orders Placed This Encounter  Procedures   XR C-ARM NO REPORT   Epidural Steroid injection    Follow-up: Return for visit to requesting provider as needed.   Procedures: No procedures performed  Cervical Epidural Steroid Injection - Interlaminar Approach with Fluoroscopic Guidance  Patient: Kelli Lewis      Date of Birth: 1981/04/02 MRN: [id number] PCP: Lenard Simmer, MD      Visit Date: 12/14/2021   Universal Protocol:    Date/Time: 08/30/238:49 PM  Consent Given By: the patient  Position: PRONE  Additional Comments: Vital signs were monitored before and after the procedure. Patient was prepped and draped in the usual sterile fashion. The correct patient, procedure, and site was verified.   Injection Procedure Details:    Procedure diagnoses: Cervical radiculopathy [M54.12]    Meds Administered:  Meds ordered this encounter  Medications   methylPREDNISolone acetate (DEPO-MEDROL) injection 40 mg     Laterality: Left  Location/Site: C7-T1  Needle: 3.5 in., 20 ga. Tuohy  Needle Placement: Paramedian epidural space  Findings:  -Comments: Excellent flow of contrast into the epidural space.  Procedure Details: Using a paramedian approach from the side mentioned above, the region overlying the inferior lamina was localized under fluoroscopic visualization and the soft tissues overlying this structure were infiltrated with 4 ml. of 1% Lidocaine without Epinephrine. A # 20 gauge, Tuohy needle was inserted into the epidural space using a paramedian approach.  The epidural space was localized using loss of resistance along with contralateral oblique bi-planar fluoroscopic views.  After negative aspirate for air, blood, and CSF, a 2 ml. volume of Isovue-250 was injected into the epidural space and the flow of contrast was observed. Radiographs were obtained for documentation purposes.   The injectate was administered into the level noted above.  Additional Comments:  The patient tolerated the procedure well Dressing: 2 x 2 sterile gauze and Band-Aid    Post-procedure details: Patient was observed during the procedure. Post-procedure instructions were reviewed.  Patient left the clinic in stable condition.   Clinical History: MRI CERVICAL SPINE WITHOUT CONTRAST   TECHNIQUE: Multiplanar, multisequence MR imaging of the cervical spine was performed. No intravenous contrast was administered.   COMPARISON:  None.   FINDINGS: Mild intermittent motion degradation.   Alignment: Cervical levocurvature. No significant spondylolisthesis.  Vertebrae: Vertebral body height is maintained. Trace degenerative endplate edema at F0-O7. Elsewhere, no significant marrow edema or focal suspicious osseous  lesion is identified.   Cord: No signal abnormality identified within the cervical spinal cord.   Posterior Fossa, vertebral arteries, paraspinal tissues: No abnormality identified within included portions of the posterior fossa. Flow voids preserved within the imaged cervical vertebral arteries. Paraspinal soft tissues unremarkable.   Disc levels:   Mild multilevel disc degeneration, greatest at C5-C6.   C2-C3: Slight disc bulge. No significant spinal canal or foraminal stenosis.   C3-C4: Slight disc bulge. No significant spinal canal or foraminal stenosis.   C4-C5: Mild disc bulge slightly asymmetric to the left. Mild bilateral uncovertebral hypertrophy. Mild partial effacement of the ventral thecal sac (without spinal cord mass effect). Minimal relative left neural foraminal narrowing.   C5-C6: Disc bulge. Post broad-based left center/foraminal disc protrusion. Associated left-sided disc osteophyte ridge/uncinate hypertrophy. Disc osteophyte ridge/uncovertebral hypertrophy also present on the right. The disc bulge and disc protrusion mildly narrow the spinal canal, contacting the ventral spinal cord. The left center/foraminal disc protrusion contributes to moderate left neural foraminal narrowing, and may affect the exiting left C6 nerve root. Mild right neural foraminal narrowing.   C6-C7: Slight disc bulge. No significant spinal canal or foraminal stenosis.   C7-T1: No significant disc herniation or stenosis.   IMPRESSION: Cervical spondylosis, as outlined and with findings most notably as follows.   At C5-C6, there is a disc bulge with superimposed broad-based left center/foraminal disc protrusion. Associated left-sided disc osteophyte ridge/uncinate hypertrophy. Disc osteophyte ridge/uncinate hypertrophy also present on the right. The disc bulge and disc protrusion mildly narrow the spinal canal, contacting the ventral spinal cord. Additionally, the disc  protrusion contributes to moderate left neural foraminal narrowing, and may affect the exiting left C6 nerve root. Correlate for left C6 radiculopathy. Mild right neural foraminal narrowing also present at this level.   At C4-C5, there is a disc bulge which is slightly asymmetric to the left. Mild bilateral uncovertebral hypertrophy. Mild partial effacement of the ventral thecal sac (without spinal cord mass effect). Minimal relative left neural foraminal narrowing.   No significant spinal canal or foraminal stenosis at the remaining levels.   Mild multilevel disc degeneration, greatest at C5-C6.     Electronically Signed   By: Kellie Simmering D.O.   On: 03/30/2021 17:10     Objective:  VS:  HT:    WT:   BMI:     BP:137/86  HR:81bpm  TEMP: ( )  RESP:  Physical Exam Vitals and nursing note reviewed.  Constitutional:      General: She is not in acute distress.    Appearance: Normal appearance. She is not ill-appearing.  HENT:     Head: Normocephalic and atraumatic.     Right Ear: External ear normal.     Left Ear: External ear normal.  Eyes:     Extraocular Movements: Extraocular movements intact.  Cardiovascular:     Rate and Rhythm: Normal rate.     Pulses: Normal pulses.  Musculoskeletal:     Cervical back: Tenderness present. No rigidity.     Right lower leg: No edema.     Left lower leg: No edema.     Comments: Patient has good strength in the upper extremities including 5 out of 5 strength in wrist extension long finger flexion and APB.  There is no atrophy of the hands intrinsically.  There is a negative Hoffmann's test.  Lymphadenopathy:     Cervical: No cervical adenopathy.  Skin:    Findings: No erythema, lesion or rash.  Neurological:     General: No focal deficit present.     Mental Status: She is alert and oriented to person, place, and time.     Sensory: No sensory deficit.     Motor: No weakness or abnormal muscle tone.     Coordination:  Coordination normal.  Psychiatric:        Mood and Affect: Mood normal.        Behavior: Behavior normal.      Imaging: No results found.

## 2021-12-23 NOTE — Procedures (Signed)
Cervical Epidural Steroid Injection - Interlaminar Approach with Fluoroscopic Guidance  Patient: Kelli Lewis      Date of Birth: [birth year] MRN: [id number] PCP: Lenard Simmer, MD      Visit Date: 12/14/2021   Universal Protocol:    Date/Time: 08/30/238:49 PM  Consent Given By: the patient  Position: PRONE  Additional Comments: Vital signs were monitored before and after the procedure. Patient was prepped and draped in the usual sterile fashion. The correct patient, procedure, and site was verified.   Injection Procedure Details:   Procedure diagnoses: Cervical radiculopathy [M54.12]    Meds Administered:  Meds ordered this encounter  Medications   methylPREDNISolone acetate (DEPO-MEDROL) injection 40 mg     Laterality: Left  Location/Site: C7-T1  Needle: 3.5 in., 20 ga. Tuohy  Needle Placement: Paramedian epidural space  Findings:  -Comments: Excellent flow of contrast into the epidural space.  Procedure Details: Using a paramedian approach from the side mentioned above, the region overlying the inferior lamina was localized under fluoroscopic visualization and the soft tissues overlying this structure were infiltrated with 4 ml. of 1% Lidocaine without Epinephrine. A # 20 gauge, Tuohy needle was inserted into the epidural space using a paramedian approach.  The epidural space was localized using loss of resistance along with contralateral oblique bi-planar fluoroscopic views.  After negative aspirate for air, blood, and CSF, a 2 ml. volume of Isovue-250 was injected into the epidural space and the flow of contrast was observed. Radiographs were obtained for documentation purposes.   The injectate was administered into the level noted above.  Additional Comments:  The patient tolerated the procedure well Dressing: 2 x 2 sterile gauze and Band-Aid    Post-procedure details: Patient was observed during the procedure. Post-procedure instructions were  reviewed.  Patient left the clinic in stable condition.

## 2022-01-05 ENCOUNTER — Ambulatory Visit
Admission: RE | Admit: 2022-01-05 | Discharge: 2022-01-05 | Disposition: A | Discharge status: Home / Self Care | Payer: BC Managed Care – PPO | Source: Ambulatory Visit | Attending: Obstetrics and Gynecology | Admitting: Obstetrics and Gynecology

## 2022-01-05 DIAGNOSIS — R922 Inconclusive mammogram: Secondary | ICD-10-CM | POA: Diagnosis not present

## 2022-01-05 DIAGNOSIS — N6321 Unspecified lump in the left breast, upper outer quadrant: Secondary | ICD-10-CM | POA: Diagnosis not present

## 2022-01-05 DIAGNOSIS — N6320 Unspecified lump in the left breast, unspecified quadrant: Secondary | ICD-10-CM

## 2022-02-17 ENCOUNTER — Ambulatory Visit
Admission: RE | Admit: 2022-02-17 | Discharge: 2022-02-17 | Disposition: A | Discharge status: Home / Self Care | Payer: No Typology Code available for payment source | Source: Ambulatory Visit | Attending: Internal Medicine | Admitting: Internal Medicine

## 2022-02-17 DIAGNOSIS — Z8249 Family history of ischemic heart disease and other diseases of the circulatory system: Secondary | ICD-10-CM | POA: Diagnosis not present

## 2022-02-17 DIAGNOSIS — Z0000 Encounter for general adult medical examination without abnormal findings: Secondary | ICD-10-CM

## 2022-02-23 DIAGNOSIS — D485 Neoplasm of uncertain behavior of skin: Secondary | ICD-10-CM | POA: Diagnosis not present

## 2022-02-23 DIAGNOSIS — D225 Melanocytic nevi of trunk: Secondary | ICD-10-CM | POA: Diagnosis not present

## 2022-03-26 DIAGNOSIS — Z0000 Encounter for general adult medical examination without abnormal findings: Secondary | ICD-10-CM | POA: Diagnosis not present

## 2022-03-26 DIAGNOSIS — K5090 Crohn's disease, unspecified, without complications: Secondary | ICD-10-CM | POA: Diagnosis not present

## 2022-03-26 DIAGNOSIS — K219 Gastro-esophageal reflux disease without esophagitis: Secondary | ICD-10-CM | POA: Diagnosis not present

## 2022-03-26 DIAGNOSIS — Z1331 Encounter for screening for depression: Secondary | ICD-10-CM | POA: Diagnosis not present

## 2022-03-26 DIAGNOSIS — E785 Hyperlipidemia, unspecified: Secondary | ICD-10-CM | POA: Diagnosis not present

## 2022-03-26 DIAGNOSIS — Z23 Encounter for immunization: Secondary | ICD-10-CM | POA: Diagnosis not present

## 2022-04-08 DIAGNOSIS — L578 Other skin changes due to chronic exposure to nonionizing radiation: Secondary | ICD-10-CM | POA: Diagnosis not present

## 2022-04-08 DIAGNOSIS — D225 Melanocytic nevi of trunk: Secondary | ICD-10-CM | POA: Diagnosis not present

## 2022-04-21 ENCOUNTER — Other Ambulatory Visit: Payer: Self-pay | Admitting: Physical Medicine and Rehabilitation

## 2022-04-21 ENCOUNTER — Telehealth: Payer: Self-pay | Admitting: Physical Medicine and Rehabilitation

## 2022-04-21 DIAGNOSIS — M5412 Radiculopathy, cervical region: Secondary | ICD-10-CM

## 2022-04-21 DIAGNOSIS — M542 Cervicalgia: Secondary | ICD-10-CM

## 2022-04-21 NOTE — Telephone Encounter (Signed)
Patient called in stating her back is still in severe pain and she would like to take the next step she said [facility] mentioned Dry needling please advise

## 2022-04-30 ENCOUNTER — Telehealth: Payer: Self-pay

## 2022-04-30 ENCOUNTER — Other Ambulatory Visit: Payer: Self-pay

## 2022-04-30 DIAGNOSIS — K523 Indeterminate colitis: Secondary | ICD-10-CM

## 2022-04-30 DIAGNOSIS — K529 Noninfective gastroenteritis and colitis, unspecified: Secondary | ICD-10-CM

## 2022-04-30 MED ORDER — MESALAMINE 1.2 G PO TBEC: 2.4000 g | DELAYED_RELEASE_TABLET | Freq: Every day | ORAL | 1 refills | Status: DC

## 2022-04-30 NOTE — Telephone Encounter (Signed)
   Pt scheduled to see Nicoletta Ba PA 05/07/22'@1'$ :30pm. Script sent in for L[place]. Lab order in epic. Pt aware.

## 2022-05-03 ENCOUNTER — Encounter: Payer: Self-pay | Admitting: Rehabilitative and Restorative Service Providers"

## 2022-05-03 ENCOUNTER — Ambulatory Visit: Payer: BC Managed Care – PPO | Admitting: Rehabilitative and Restorative Service Providers"

## 2022-05-03 ENCOUNTER — Other Ambulatory Visit: Payer: Self-pay

## 2022-05-03 ENCOUNTER — Other Ambulatory Visit: Payer: BC Managed Care – PPO

## 2022-05-03 DIAGNOSIS — M542 Cervicalgia: Secondary | ICD-10-CM | POA: Diagnosis not present

## 2022-05-03 DIAGNOSIS — K523 Indeterminate colitis: Secondary | ICD-10-CM

## 2022-05-03 DIAGNOSIS — R293 Abnormal posture: Secondary | ICD-10-CM

## 2022-05-03 DIAGNOSIS — K529 Noninfective gastroenteritis and colitis, unspecified: Secondary | ICD-10-CM

## 2022-05-03 NOTE — Therapy (Signed)
OUTPATIENT PHYSICAL THERAPY EVALUATION   Patient Name: Kelli Lewis MRN: [id number] DOB:September 26, 1980, 42 y.o., female Today's Date: 05/03/2022  END OF SESSION:  PT End of Session - 05/03/22 1550     Visit Number 1    Number of Visits 20    Date for PT Re-Evaluation 07/12/22    Authorization Type [employer] $50 copay    Authorization - Number of Visits 30    PT Start Time 2620    PT Stop Time 1640    PT Time Calculation (min) 43 min    Activity Tolerance Patient tolerated treatment well    Behavior During Therapy [facility] for tasks assessed/performed             Past Medical History:  Diagnosis Date   Allergy    Anal fissure 01/04/2011   Anxiety    no per pt   Asthma    seasonal allergy induced does not use inhalers   Colon polyps    Colon ulcer    Crohn's disease ([place])    Diverticulosis    Duodenal diverticulum    Family history of malignant neoplasm of gastrointestinal tract    GERD (gastroesophageal reflux disease)    hx of with pregnancy   Headache    occas migraines   Heart murmur    Herpes    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, melanoma   Rectal carcinoid tumor    Vitamin B12 deficiency    Vitamin D deficiency    Past Surgical History:  Procedure Laterality Date   COLONOSCOPY     EUS N/A 05/02/2014   Procedure: LOWER ENDOSCOPIC ULTRASOUND (EUS);  Surgeon: Milus Banister, MD;  Location: [facility];  Service: Endoscopy;  Laterality: N/A;   MELANOMA EXCISION     PARTIAL PROCTECTOMY BY TEM N/A 05/23/2014   Procedure: TEM PARTIAL PROCTECTOMY OF RECTAL MASS;  Surgeon:  Boston, MD;  Location: [facility];  Service: General;  Laterality: N/A;   [place] EXTRACTION     Patient Active Problem List   Diagnosis Date Noted   Indication for care in labor or delivery 01/07/2018   NSVD (normal spontaneous vaginal delivery) 01/07/2018   Rectal carcinoid tumor 05/23/2014   Melanoma of skin, site unspecified 01/22/2013   IBD (inflammatory bowel  disease) 02/26/2011    PCP: Lenard Simmer MD  REFERRING PROVIDER: Lorine Bears, NP  REFERRING DIAG: M54.2 (ICD-10-CM) - Cervicalgia M54.12 (ICD-10-CM) - Cervical radiculopathy  THERAPY DIAG:  Cervicalgia  Abnormal posture  Rationale for Evaluation and Treatment: Rehabilitation  ONSET DATE: Approx. 04/2021  SUBJECTIVE:  SUBJECTIVE STATEMENT: Pt indicated having difficulty with prolonged sitting at work with computer work activity.  Pt indicated having pain, headaches and vision changes at times with prolonged activity.  History of injection without real improvement long term. Pt indicated feeling like it can be muscle related.   Pt indicated primary symptoms originated with numbness/tingling in Lt arm but not as much recently.   PERTINENT HISTORY:  GERD, Melanoma, Crohn's Disease.  History of injections to area.   PAIN:  NPRS scale: at worst 8/10, at current today Pain location: cervical pain, behind Rt eye mainly for headaches Pain description: sore, distracting, headaches Aggravating factors: work positioning, lifting, turning head Relieving factors: stretching, rest from work, tylenol, steriod pack  PRECAUTIONS: None  WEIGHT BEARING RESTRICTIONS: No  FALLS:  Has patient fallen in last 6 months? No  LIVING ENVIRONMENT: Lives with: lives with their family Lives in:    OCCUPATION: Work requires sitting  PLOF: Independent, has 43 year old, reading, Rt handed   PATIENT GOALS: Reduce pain, improve sleep/positioning at work.    OBJECTIVE:   PATIENT SURVEYS:  05/03/2022 FOTO intake: 61   predicted:  68  COGNITION: 05/03/2022 Overall cognitive status: Within functional limits for tasks assessed  SENSATION: 05/03/2022 [facility]  POSTURE: 05/03/2022 Mild forward head  posture, rounded shoulders  PALPATION: 05/03/2022 Numerous trigger points bilateral upper trap , levator scap  PAIVM limitation in mid thoracic throughout for cPA   CERVICAL ROM:   ROM AROM (deg) 05/03/2022  Flexion 65 c pain noted  Extension 89  Right lateral flexion   Left lateral flexion   Right rotation 85  Left rotation 85   (Blank rows = not tested)  UPPER EXTREMITY ROM:  ROM Right 05/03/2022 Left 05/03/2022  Shoulder flexion    Shoulder extension    Shoulder abduction    Shoulder adduction    Shoulder extension    Shoulder internal rotation    Shoulder external rotation    Elbow flexion    Elbow extension    Wrist flexion    Wrist extension    Wrist ulnar deviation    Wrist radial deviation    Wrist pronation    Wrist supination     (Blank rows = not tested)  UPPER EXTREMITY MMT:  MMT Right 05/03/2022 Left 05/03/2022  Shoulder flexion 5/5 5/5  Shoulder extension    Shoulder abduction 5/5 5/5  Shoulder adduction    Shoulder extension    Shoulder internal rotation 5/5 5/5  Shoulder external rotation 4+/5 4+/5  Middle trapezius    Lower trapezius    Elbow flexion    Elbow extension    Wrist flexion    Wrist extension    Wrist ulnar deviation    Wrist radial deviation    Wrist pronation    Wrist supination    Grip strength     (Blank rows = not tested)  CERVICAL SPECIAL TESTS:  05/03/2022 No specific testing today   FUNCTIONAL TESTS:  05/03/2022 No specific testing today   TODAY'S TREATMENT:  DATE:  05/03/2022 Therex:    HEP instruction/performance c cues for techniques, handout provided.  Trial set performed of each for comprehension and symptom assessment.  See below for exercise list  Manual: Compression to Rt upper trap, percussive device to Lt upper trap trigger points  Trigger Point Dry-Needling  Treatment instructions: Expect  mild to moderate muscle soreness. S/S of pneumothorax if dry needled over a lung field, and to seek immediate medical attention should they occur. Patient verbalized understanding of these instructions and education.  Patient Consent Given: Yes Education handout provided: Yes Muscles treated: Rt upper trap  Treatment response/outcome: Concordant symptoms, fair tolerance, no adverse reactions observed other than typical soreness.   Moist heat to Rt upper trap during HEP education/performance.   PATIENT EDUCATION:  05/03/2022 Education details: HEP, POC, DN Person educated: Patient Education method: Explanation, Demonstration, Verbal cues, and Handouts Education comprehension: verbalized understanding, returned demonstration, and verbal cues required  HOME EXERCISE PROGRAM: Access Code: [email or web address] URL: [email or web address] Date: 05/03/2022 Prepared by: Scot Jun  Exercises - Seated Upper Trapezius Stretch  - 3-5 x daily - 7 x weekly - 1 sets - 3 reps - 15 hold - Gentle Levator Scapulae Stretch  - 3-5 x daily - 7 x weekly - 1 sets - 3 reps - 15 hold - Shoulder External Rotation and Scapular Retraction with Resistance  - 1-2 x daily - 7 x weekly - 1-2 sets - 10-15 reps - Seated Cervical Retraction  - 1 x daily - 7 x weekly - 3 sets - 10 reps  ASSESSMENT:  CLINICAL IMPRESSION: Patient is a 41 y.o. who comes to clinic with complaints of cervical with mobility, strength and movement coordination deficits that impair their ability to perform usual daily and recreational functional activities without increase difficulty/symptoms at this time.  Patient to benefit from skilled PT services to address impairments and limitations to improve to previous level of function without restriction secondary to condition.    OBJECTIVE IMPAIRMENTS: decreased endurance, decreased mobility, decreased ROM, decreased strength, increased fascial restrictions, impaired perceived functional  ability, increased muscle spasms, impaired flexibility, impaired UE functional use, improper body mechanics, postural dysfunction, and pain.   ACTIVITY LIMITATIONS: carrying, lifting, bending, sitting, and sleeping  PARTICIPATION LIMITATIONS: interpersonal relationship, community activity, and occupation  PERSONAL FACTORS:  No specific factors  are also affecting patient's functional outcome.   REHAB POTENTIAL: Good  CLINICAL DECISION MAKING: Stable/uncomplicated  EVALUATION COMPLEXITY: Low   GOALS: Goals reviewed with patient? Yes  SHORT TERM GOALS: (target date for Short term goals are 3 weeks 05/24/2022)  1.Patient will demonstrate independent use of home exercise program to maintain progress from in clinic treatments. Goal status: New  LONG TERM GOALS: (target dates for all long term goals are 10 weeks  07/12/2022 )   1. Patient will demonstrate/report pain at worst less than or equal to 2/10 to facilitate minimal limitation in daily activity secondary to pain symptoms. Goal status: New   2. Patient will demonstrate independent use of home exercise program to facilitate ability to maintain/progress functional gains from skilled physical therapy services. Goal status: New   3. Patient will demonstrate FOTO outcome > or = 68 % to indicate reduced disability due to condition. Goal status: New   4.  Patient will demonstrate cervical AROM WFL s symptoms to facilitate usual head movements for daily activity including driving, self care.   Goal status: New   5.  Patient will demonstrate bilateral shoulder MMT 5/5 throughout to facilitate lifting,  carrying at [facility].   Goal status: New   6.  Patient will demonstrate/report ability to sleep s restriction due to symptoms.  Goal status: New    PLAN:  PT FREQUENCY: 1-2x/week  PT DURATION: 10 weeks  PLANNED INTERVENTIONS: Therapeutic exercises, Therapeutic activity, Neuro Muscular re-education, Balance training, Gait training,  Patient/Family education, Joint mobilization, Stair training, DME instructions, Dry Needling, Electrical stimulation, Cryotherapy, vasopneumatic device,Traction, Moist heat, Taping, Ultrasound, Ionotophoresis '4mg'$ /ml Dexamethasone, and Manual therapy.  All included unless contraindicated  PLAN FOR NEXT SESSION: Check HEP use/response.  DN as desired.    Scot Jun, PT, DPT, OCS, ATC 05/03/22  4:46 PM

## 2022-05-04 NOTE — Therapy (Signed)
OUTPATIENT PHYSICAL THERAPY TREATMENT NOTE   Patient Name: Kelli Lewis MRN: [id number] DOB:21-Apr-1981, 42 y.o., female Today's Date: 05/04/2022  END OF SESSION:    Past Medical History:  Diagnosis Date   Allergy    Anal fissure 01/04/2011   Anxiety    no per pt   Asthma    seasonal allergy induced does not use inhalers   Colon polyps    Colon ulcer    Crohn's disease ([place])    Diverticulosis    Duodenal diverticulum    Family history of malignant neoplasm of gastrointestinal tract    GERD (gastroesophageal reflux disease)    hx of with pregnancy   Headache    occas migraines   Heart murmur    Herpes    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[place])    right gluteal 2002-2003, melanoma   Rectal carcinoid tumor    Vitamin B12 deficiency    Vitamin D deficiency    Past Surgical History:  Procedure Laterality Date   COLONOSCOPY     EUS N/A 05/02/2014   Procedure: LOWER ENDOSCOPIC ULTRASOUND (EUS);  Surgeon: Milus Banister, MD;  Location: [facility];  Service: Endoscopy;  Laterality: N/A;   MELANOMA EXCISION     PARTIAL PROCTECTOMY BY TEM N/A 05/23/2014   Procedure: TEM PARTIAL PROCTECTOMY OF RECTAL MASS;  Surgeon: Michael Boston, MD;  Location: [facility];  Service: General;  Laterality: N/A;   WISDOM TOOTH EXTRACTION     Patient Active Problem List   Diagnosis Date Noted   Indication for care in labor or delivery 01/07/2018   NSVD (normal spontaneous vaginal delivery) 01/07/2018   Rectal carcinoid tumor 05/23/2014   Melanoma of skin, site unspecified 01/22/2013   IBD (inflammatory bowel disease) 02/26/2011     THERAPY DIAG:  No diagnosis found.   PCP: Lenard Simmer MD  REFERRING PROVIDER: Lorine Bears, NP  REFERRING DIAG: M54.2 (ICD-10-CM) - Cervicalgia M54.12 (ICD-10-CM) - Cervical radiculopathy  EVAL THERAPY DIAG:  Cervicalgia  Abnormal posture  Rationale for Evaluation and Treatment: Rehabilitation  ONSET DATE: Approx. 04/2021  SUBJECTIVE:                                                                                                                                                                                                          SUBJECTIVE STATEMENT: *** Pt indicated having difficulty with prolonged sitting at work with computer work activity.  Pt indicated having pain, headaches and vision changes at times with prolonged activity.  History of injection without real improvement long term. Pt indicated  feeling like it can be muscle related.   Pt indicated primary symptoms originated with numbness/tingling in Lt arm but not as much recently.   PERTINENT HISTORY:  GERD, Melanoma, Crohn's Disease.  History of injections to area.   PAIN:  NPRS scale: at worst 8/10, at current today Pain location: cervical pain, behind Rt eye mainly for headaches Pain description: sore, distracting, headaches Aggravating factors: work positioning, lifting, turning head Relieving factors: stretching, rest from work, tylenol, steriod pack  PRECAUTIONS: None  WEIGHT BEARING RESTRICTIONS: No  FALLS:  Has patient fallen in last 6 months? No  LIVING ENVIRONMENT: Lives with: lives with their family Lives in:    OCCUPATION: Work requires sitting  PLOF: Independent, has 43 year old, reading, Rt handed   PATIENT GOALS: Reduce pain, improve sleep/positioning at work.    OBJECTIVE:   PATIENT SURVEYS:  05/03/2022 FOTO intake: 61   predicted:  68  POSTURE: 05/03/2022 Mild forward head posture, rounded shoulders  PALPATION: 05/03/2022 Numerous trigger points bilateral upper trap , levator scap  PAIVM limitation in mid thoracic throughout for cPA   CERVICAL ROM:   ROM AROM (deg) 05/03/2022  Flexion 65 c pain noted  Extension 89  Right lateral flexion   Left lateral flexion   Right rotation 85  Left rotation 85   (Blank rows = not tested)  UPPER EXTREMITY ROM:  ROM Right 05/03/2022 Left 05/03/2022  Shoulder flexion    Shoulder extension     Shoulder abduction    Shoulder adduction    Shoulder extension    Shoulder internal rotation    Shoulder external rotation    Elbow flexion    Elbow extension    Wrist flexion    Wrist extension    Wrist ulnar deviation    Wrist radial deviation    Wrist pronation    Wrist supination     (Blank rows = not tested)  UPPER EXTREMITY MMT:  MMT Right 05/03/2022 Left 05/03/2022  Shoulder flexion 5/5 5/5  Shoulder extension    Shoulder abduction 5/5 5/5  Shoulder adduction    Shoulder extension    Shoulder internal rotation 5/5 5/5  Shoulder external rotation 4+/5 4+/5  Middle trapezius    Lower trapezius    Elbow flexion    Elbow extension    Wrist flexion    Wrist extension    Wrist ulnar deviation    Wrist radial deviation    Wrist pronation    Wrist supination    Grip strength     (Blank rows = not tested)  CERVICAL SPECIAL TESTS:  05/03/2022 No specific testing today   FUNCTIONAL TESTS:  05/03/2022 No specific testing today   TODAY'S TREATMENT: DATE:  05/05/2022 ***  DATE:  05/03/2022 Therex:    HEP instruction/performance c cues for techniques, handout provided.  Trial set performed of each for comprehension and symptom assessment.  See below for exercise list  Manual: Compression to Rt upper trap, percussive device to Lt upper trap trigger points  Trigger Point Dry-Needling  Treatment instructions: Expect mild to moderate muscle soreness. S/S of pneumothorax if dry needled over a lung field, and to seek immediate medical attention should they occur. Patient verbalized understanding of these instructions and education.  Patient Consent Given: Yes Education handout provided: Yes Muscles treated: Rt upper trap  Treatment response/outcome: Concordant symptoms, fair tolerance, no adverse reactions observed other than typical soreness.   Moist heat to Rt upper trap during HEP education/performance.   PATIENT EDUCATION:  05/03/2022 Education details: HEP, POC,  DN Person educated: Patient Education method: Explanation, Demonstration, Verbal cues, and Handouts Education comprehension: verbalized understanding, returned demonstration, and verbal cues required  HOME EXERCISE PROGRAM: Access Code: [email or web address] URL: [email or web address] Date: 05/03/2022 Prepared by: Scot Jun  Exercises - Seated Upper Trapezius Stretch  - 3-5 x daily - 7 x weekly - 1 sets - 3 reps - 15 hold - Gentle Levator Scapulae Stretch  - 3-5 x daily - 7 x weekly - 1 sets - 3 reps - 15 hold - Shoulder External Rotation and Scapular Retraction with Resistance  - 1-2 x daily - 7 x weekly - 1-2 sets - 10-15 reps - Seated Cervical Retraction  - 1 x daily - 7 x weekly - 3 sets - 10 reps  ASSESSMENT:  CLINICAL IMPRESSION: *** Patient is a 42 y.o. who comes to clinic with complaints of cervical with mobility, strength and movement coordination deficits that impair their ability to perform usual daily and recreational functional activities without increase difficulty/symptoms at this time.  Patient to benefit from skilled PT services to address impairments and limitations to improve to previous level of function without restriction secondary to condition.    OBJECTIVE IMPAIRMENTS: decreased endurance, decreased mobility, decreased ROM, decreased strength, increased fascial restrictions, impaired perceived functional ability, increased muscle spasms, impaired flexibility, impaired UE functional use, improper body mechanics, postural dysfunction, and pain.   ACTIVITY LIMITATIONS: carrying, lifting, bending, sitting, and sleeping  PARTICIPATION LIMITATIONS: interpersonal relationship, community activity, and occupation  PERSONAL FACTORS: No specific factors are also affecting patient's functional outcome.   REHAB POTENTIAL: Good  CLINICAL DECISION MAKING: Stable/uncomplicated  EVALUATION COMPLEXITY: Low   GOALS: Goals reviewed with patient? Yes  SHORT TERM  GOALS: (target date for Short term goals are 3 weeks 05/24/2022)  1.Patient will demonstrate independent use of home exercise program to maintain progress from in clinic treatments. Goal status: New  LONG TERM GOALS: (target dates for all long term goals are 10 weeks  07/12/2022 )   1. Patient will demonstrate/report pain at worst less than or equal to 2/10 to facilitate minimal limitation in daily activity secondary to pain symptoms. Goal status: New   2. Patient will demonstrate independent use of home exercise program to facilitate ability to maintain/progress functional gains from skilled physical therapy services. Goal status: New   3. Patient will demonstrate FOTO outcome > or = 68 % to indicate reduced disability due to condition. Goal status: New   4.  Patient will demonstrate cervical AROM WFL s symptoms to facilitate usual head movements for daily activity including driving, self care.   Goal status: New   5.  Patient will demonstrate bilateral shoulder MMT 5/5 throughout to facilitate lifting, carrying at PLOF.   Goal status: New   6.  Patient will demonstrate/report ability to sleep s restriction due to symptoms.  Goal status: New    PLAN:  PT FREQUENCY: 1-2x/week  PT DURATION: 10 weeks  PLANNED INTERVENTIONS: Therapeutic exercises, Therapeutic activity, Neuro Muscular re-education, Balance training, Gait training, Patient/Family education, Joint mobilization, Stair training, DME instructions, Dry Needling, Electrical stimulation, Cryotherapy, vasopneumatic device,Traction, Moist heat, Taping, Ultrasound, Ionotophoresis '4mg'$ /ml Dexamethasone, and Manual therapy.  All included unless contraindicated  PLAN FOR NEXT SESSION: *** Check HEP use/response.  DN as desired.     Laureen Abrahams, PT, DPT 05/04/22 2:16 PM

## 2022-05-05 ENCOUNTER — Encounter: Payer: Self-pay | Admitting: Physical Therapy

## 2022-05-05 ENCOUNTER — Ambulatory Visit: Payer: BC Managed Care – PPO | Admitting: Physical Therapy

## 2022-05-05 DIAGNOSIS — M542 Cervicalgia: Secondary | ICD-10-CM

## 2022-05-05 DIAGNOSIS — R293 Abnormal posture: Secondary | ICD-10-CM | POA: Diagnosis not present

## 2022-05-07 ENCOUNTER — Other Ambulatory Visit (INDEPENDENT_AMBULATORY_CARE_PROVIDER_SITE_OTHER): Payer: BC Managed Care – PPO

## 2022-05-07 ENCOUNTER — Encounter: Payer: Self-pay | Admitting: Physician Assistant

## 2022-05-07 ENCOUNTER — Ambulatory Visit: Payer: BC Managed Care – PPO | Admitting: Physician Assistant

## 2022-05-07 VITALS — BP 124/72 | HR 77 | Ht 64.0 in | Wt 154.2 lb

## 2022-05-07 DIAGNOSIS — K523 Indeterminate colitis: Secondary | ICD-10-CM

## 2022-05-07 DIAGNOSIS — C7A026 Malignant carcinoid tumor of the rectum: Secondary | ICD-10-CM

## 2022-05-07 LAB — CBC WITH DIFFERENTIAL/PLATELET
Basophils Absolute: 0.1 10*3/uL (ref 0.0–0.1)
Basophils Relative: 1.2 % (ref 0.0–3.0)
Eosinophils Absolute: 0.3 10*3/uL (ref 0.0–0.7)
Eosinophils Relative: 2.9 % (ref 0.0–5.0)
HCT: 41.5 % (ref 36.0–46.0)
Hemoglobin: 14.4 g/dL (ref 12.0–15.0)
Lymphocytes Relative: 25.5 % (ref 12.0–46.0)
Lymphs Abs: 2.6 10*3/uL (ref 0.7–4.0)
MCHC: 34.6 g/dL (ref 30.0–36.0)
MCV: 80.4 fl (ref 78.0–100.0)
Monocytes Absolute: 0.5 10*3/uL (ref 0.1–1.0)
Monocytes Relative: 4.4 % (ref 3.0–12.0)
Neutro Abs: 6.8 10*3/uL (ref 1.4–7.7)
Neutrophils Relative %: 66 % (ref 43.0–77.0)
Platelets: 377 10*3/uL (ref 150.0–400.0)
RBC: 5.16 Mil/uL — ABNORMAL HIGH (ref 3.87–5.11)
RDW: 13.8 % (ref 11.5–15.5)
WBC: 10.4 10*3/uL (ref 4.0–10.5)

## 2022-05-07 LAB — HIGH SENSITIVITY CRP: CRP, High Sensitivity: 12.23 mg/L — ABNORMAL HIGH (ref 0.000–5.000)

## 2022-05-07 LAB — SEDIMENTATION RATE: Sed Rate: 16 mm/hr (ref 0–20)

## 2022-05-07 MED ORDER — MESALAMINE 1.2 G PO TBEC: 4.8000 g | DELAYED_RELEASE_TABLET | Freq: Every day | ORAL | 11 refills | Status: DC

## 2022-05-07 NOTE — Progress Notes (Signed)
Subjective:    Patient ID: Kelli Lewis, female    DOB: 1980/04/29, 42 y.o.   MRN: [id number]  HPI Kelli Lewis is a pleasant 42 year old white female, established with Dr. Hilarie Fredrickson, she has diagnosis of indeterminate colitis, history of rectal carcinoid, status post endoscopic resection in 2016 and then partial colectomy 2016.  Also with history of GERD and B12 deficiency.  She was last seen here in March 2020 and at that time symptoms were in remission, She had been off of Lialda for some months prior to that time. She underwent colonoscopy then in July 2020 was found to have a 5 mm polyp in the ascending colon which was a tubular adenoma, multiple diverticuli normal-appearing terminal ileum and there was some localized erythema of the rectum but otherwise negative exam.  Biopsies from the rectum showed some minimally active colitis consistent with proctitis but no definite features of IBD.  She was advised to resume Lialda but says she was not having any symptoms at that time and did not resume Lialda and has not been on since.  She says she did very well for a couple of years, did take some brief courses of Lialda if she would develop abdominal pain or frequent bowel movements but has not stayed on any maintenance dose.  Over the past 4 to 6 months she has had increase in bowel frequency up to 5-6 bowel movements per day, sometimes diarrhea.  She is not generally having any abdominal pain or cramping but over the past week did develop abdominal pain and bloating and ongoing discomfort.  She says she had gotten to the point where every time she would urinate she would pass a small amount of loose stool.  Has not seen any blood. She called here and has been restarted on Lialda 4.8 g daily over the past week and says she is feeling better with improvement in symptoms but still having increased frequency of bowel movements over her usual. Fecal calprotectin had been ordered and is still pending. She reports  having labs done through her PCP Dr. Virgina Jock very recently.   Review of Systems Pertinent positive and negative review of systems were noted in the above HPI section.  All other review of systems was otherwise negative.   Outpatient Encounter Medications as of 05/07/2022  Medication Sig   levonorgestrel (MIRENA, 52 MG,) 20 MCG/DAY IUD 1 each by Intrauterine route once.   valACYclovir (VALTREX) 500 MG tablet Take 500 mg by mouth daily.   [DISCONTINUED] mesalamine (LIALDA) 1.2 g EC tablet Take 2 tablets (2.4 g total) by mouth daily with breakfast.   celecoxib (CELEBREX) 200 MG capsule TAKE 1 CAPSULE BY MOUTH EVERY DAY FOR 2 WEEKS   ibuprofen (ADVIL) 600 MG tablet Take 1 tablet (600 mg total) by mouth every 6 (six) hours as needed.   mesalamine (LIALDA) 1.2 g EC tablet Take 4 tablets (4.8 g total) by mouth daily with breakfast. Take 4 tablets once a day   methocarbamol (ROBAXIN) 500 MG tablet Take 1 tablet (500 mg total) by mouth 2 (two) times daily as needed for muscle spasms.   Prenatal Vit-Fe Fumarate-FA (PRENATAL MULTIVITAMIN) TABS tablet Take 1 tablet by mouth daily at 12 noon.   traMADol (ULTRAM) 50 MG tablet Take 1 tablet (50 mg total) by mouth at bedtime as needed.   vitamin C (ASCORBIC ACID) 500 MG tablet Take 500 mg by mouth daily.   Facility-Administered Encounter Medications as of 05/07/2022  Medication   0.9 %  sodium chloride infusion   pneumococcal 23 valent vaccine (PNU-IMMUNE) injection 0.5 mL   No Known Allergies Patient Active Problem List   Diagnosis Date Noted   Indication for care in labor or delivery 01/07/2018   NSVD (normal spontaneous vaginal delivery) 01/07/2018   Rectal carcinoid tumor 05/23/2014   Melanoma of skin, site unspecified 01/22/2013   IBD (inflammatory bowel disease) 02/26/2011   Social History   Socioeconomic History   Marital status: Married    Spouse name: Not on file   Number of children: 1   Years of education: Not on file   Highest  education level: Not on file  Occupational History   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  Tobacco Use   Smoking status: Never   Smokeless tobacco: Never  Vaping Use   Vaping Use: Never used  Substance and Sexual Activity   Alcohol use: Yes    Comment: once monthly - beer   Drug use: No   Sexual activity: Yes    Birth control/protection: I.U.D.  Other Topics Concern   Not on file  Social History Narrative   Not on file   Social Determinants of Health   Financial Resource Strain: Not on file  Food Insecurity: Not on file  Transportation Needs: Not on file  Physical Activity: Not on file  Stress: Not on file  Social Connections: Not on file  Intimate Partner Violence: Not on file    Kelli Lewis's family history includes Breast cancer in her paternal grandmother; Colon cancer in her paternal grandmother; Colon polyps in her father; Crohn's disease in her paternal grandmother; Irritable bowel syndrome in her mother; Parkinsonism in her father.      Objective:    Vitals:   05/07/22 1327  BP: 124/72  Pulse: 77    Physical Exam Well-developed well-nourished  WF  in no acute distress.  Height, WKGSUP,103 BMI 26.4  HEENT; nontraumatic normocephalic, EOMI, PE R LA, sclera anicteric. Oropharynx;not examined  Neck; supple, no JVD Cardiovascular; regular rate and rhythm with S1-S2, no murmur rub or gallop Pulmonary; Clear bilaterally Abdomen; soft,  nondistended,mild tenderness bilat LQ , no guarding no palpable mass or hepatosplenomegaly, bowel sounds are active Rectal;not done Skin; benign exam, no jaundice rash or appreciable lesions Extremities; no clubbing cyanosis or edema skin warm and dry Neuro/Psych; alert and oriented x4, grossly nonfocal mood and affect appropriate        Assessment & Plan:   #48 42 year old white female with history of indeterminant colitis, who had been in remission when last seen in 2020 and not on any medication.  Has done well with  Lialda in the past. At colonoscopy July 2020 she did have a 5 mm tubular adenomatous polyp and some mild localized erythema of the rectum but otherwise unremarkable exam.  Path showed minimally active colitis consistent with proctitis but no definite features of IBD. She has not been on any maintenance dose of mesalamine since that time.  Says she did well for a couple of years but over the past 4 to 6 months now having 5-6 bowel movements per day sometimes diarrhea and then about a week ago had significant abdominal pain in the lower abdomen and bloating.  She called and has restarted Lialda at 4.8 g daily and is already had some improvement in symptoms.   Fecal calprotectin pending  Her symptoms are consistent with what she experiences with exacerbation of her indeterminant colitis.  #2 history of rectal carcinoid status post  endoscopic, then surgical resection 2016. #3 history of melanoma #4  history of adenomatous colon polyp-indicated for 5-year interval follow-up which would be July 2025  Plan; await fecal calprotectin Check CBC with differential, sed rate and CRP If she has not had recently through her PCP she will need B12 and vitamin D levels done at next office visit. Since she is improving on Lialda 4.8 g daily, this will be continued, refills sent. We discussed doing follow-up colonoscopy to reassess her disease.  She already has a follow-up scheduled with Dr. Hilarie Fredrickson in March.  Since she is improving I think okay to continue Lialda and observe.  She is asked to call should she have any worsening of symptoms over the next month and at that point would go ahead with colonoscopy. In addition if she has markedly elevated fecal calprotectin and/or inflammatory markers may be best to proceed with colonoscopy for reassessment for extent of disease.  Kelli Lewis S Wes Lezotte PA-C 05/07/2022   Cc: Lenard Simmer, MD

## 2022-05-07 NOTE — Patient Instructions (Addendum)
_______________________________________________________  If your blood pressure at your visit was 140/90 or greater, please contact your primary care physician to follow up on this.  _______________________________________________________  If you are age 43 or older, your body mass index should be between 23-30. Your Body mass index is 26.48 kg/m. If this is out of the aforementioned range listed, please consider follow up with your Primary Care Provider.  If you are age 45 or younger, your body mass index should be between 19-25. Your Body mass index is 26.48 kg/m. If this is out of the aformentioned range listed, please consider follow up with your Primary Care Provider.   ________________________________________________________  The [facility] providers would like to encourage you to use [facility] to communicate with providers for non-urgent requests or questions.  Due to long hold times on the telephone, sending your provider a message by [facility] may be a faster and more efficient way to get a response.  Please allow 48 business hours for a response.  Please remember that this is for non-urgent requests.  _______________________________________________________  Your provider has requested that you go to the basement level for lab work before leaving today. Press "B" on the elevator. The lab is located at the first door on the left as you exit the elevator.  Due to recent changes in healthcare laws, you may see the results of your imaging and laboratory studies on MyChart before your provider has had a chance to review them.  We understand that in some cases there may be results that are confusing or concerning to you. Not all laboratory results come back in the same time frame and the provider may be waiting for multiple results in order to interpret others.  Please give [place] 48 hours in order for your provider to thoroughly review all the results before contacting the office for clarification of  your results.   Please call and speak to Vaughan Basta if you have any worsening symptoms over the next 4-6- weeks  It was a pleasure to see you today!  Thank you for trusting me with your gastrointestinal care!

## 2022-05-10 LAB — CALPROTECTIN, FECAL: Calprotectin, Fecal: <5 ug/g (ref 0–120)

## 2022-05-12 NOTE — Progress Notes (Signed)
Addendum: Reviewed and agree with assessment and management plan. Serenah Mill M, MD  

## 2022-05-13 ENCOUNTER — Encounter: Payer: Self-pay | Admitting: Rehabilitative and Restorative Service Providers"

## 2022-05-13 ENCOUNTER — Ambulatory Visit: Payer: BC Managed Care – PPO | Admitting: Rehabilitative and Restorative Service Providers"

## 2022-05-13 DIAGNOSIS — M542 Cervicalgia: Secondary | ICD-10-CM | POA: Diagnosis not present

## 2022-05-13 DIAGNOSIS — R293 Abnormal posture: Secondary | ICD-10-CM | POA: Diagnosis not present

## 2022-05-13 NOTE — Therapy (Addendum)
OUTPATIENT PHYSICAL THERAPY TREATMENT NOTE   Patient Name: Kelli Lewis MRN: [id number] DOB:11-08-1980, 42 y.o., female Today's Date: 05/13/2022  END OF SESSION:   PT End of Session - 05/13/22 1145     Visit Number 3    Number of Visits 20    Date for PT Re-Evaluation 07/12/22    Authorization Type [employer] $50 copay    Authorization - Number of Visits 30    PT Start Time 4825    PT Stop Time 1213    PT Time Calculation (min) 28 min    Activity Tolerance Patient tolerated treatment well    Behavior During Therapy WFL for tasks assessed/performed              Past Medical History:  Diagnosis Date   Allergy    Anal fissure 01/04/2011   Anxiety    no per pt   Asthma    seasonal allergy induced does not use inhalers   Colon polyps    Colon ulcer    Crohn's disease ([place])    Diverticulosis    Duodenal diverticulum    Family history of malignant neoplasm of gastrointestinal tract    GERD (gastroesophageal reflux disease)    hx of with pregnancy   Headache    occas migraines   Heart murmur    Herpes    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[place])    right gluteal 2002-2003, melanoma   Rectal carcinoid tumor    Vitamin B12 deficiency    Vitamin D deficiency    Past Surgical History:  Procedure Laterality Date   COLONOSCOPY     EUS N/A 05/02/2014   Procedure: LOWER ENDOSCOPIC ULTRASOUND (EUS);  Surgeon: Milus Banister, MD;  Location: [facility];  Service: Endoscopy;  Laterality: N/A;   MELANOMA EXCISION     PARTIAL PROCTECTOMY BY TEM N/A 05/23/2014   Procedure: TEM PARTIAL PROCTECTOMY OF RECTAL MASS;  Surgeon:  Boston, MD;  Location: [facility];  Service: General;  Laterality: N/A;   [place] EXTRACTION     Patient Active Problem List   Diagnosis Date Noted   Indication for care in labor or delivery 01/07/2018   NSVD (normal spontaneous vaginal delivery) 01/07/2018   Rectal carcinoid tumor 05/23/2014   Melanoma of skin, site unspecified 01/22/2013   IBD (inflammatory bowel  disease) 02/26/2011     THERAPY DIAG:  Cervicalgia  Abnormal posture   PCP: Lenard Simmer MD  REFERRING PROVIDER: Lorine Bears, NP  REFERRING DIAG: M54.2 (ICD-10-CM) - Cervicalgia M54.12 (ICD-10-CM) - Cervical radiculopathy  EVAL THERAPY DIAG:  Cervicalgia  Abnormal posture  Rationale for Evaluation and Treatment: Rehabilitation  ONSET DATE: Approx. 04/2021  SUBJECTIVE:  SUBJECTIVE STATEMENT: She indicated 3/10 at worst in last few days, more stiff than anything.   PERTINENT HISTORY:  GERD, Melanoma, Crohn's Disease.  History of injections to area.   PAIN:  NPRS scale: 1/10 Pain location: cervical pain, behind Rt eye mainly for headaches Pain description: sore, distracting, headaches Aggravating factors: work positioning, lifting, turning head Relieving factors: stretching, rest from work, tylenol, steriod pack  PRECAUTIONS: None  WEIGHT BEARING RESTRICTIONS: No  FALLS:  Has patient fallen in last 6 months? No  LIVING ENVIRONMENT: Lives with: lives with their family Lives in:    OCCUPATION: Work requires sitting  PLOF: Independent, has 43 year old, reading, Rt handed   PATIENT GOALS: Reduce pain, improve sleep/positioning at work.    OBJECTIVE:   PATIENT SURVEYS:  05/03/2022 FOTO intake: 61   predicted:  68  POSTURE: 05/03/2022 Mild forward head posture, rounded shoulders  PALPATION: 05/03/2022 Numerous trigger points bilateral upper trap , levator scap  PAIVM limitation in mid thoracic throughout for cPA   CERVICAL ROM:   ROM AROM (deg) 05/03/2022 AROM 05/13/2022  Flexion 65 c pain noted 62  Extension 89 85  Right lateral flexion    Left lateral flexion    Right rotation 85   Left rotation 85    (Blank rows = not tested)  UPPER  EXTREMITY ROM:  ROM Right 05/03/2022 Left 05/03/2022  Shoulder flexion    Shoulder extension    Shoulder abduction    Shoulder adduction    Shoulder extension    Shoulder internal rotation    Shoulder external rotation    Elbow flexion    Elbow extension    Wrist flexion    Wrist extension    Wrist ulnar deviation    Wrist radial deviation    Wrist pronation    Wrist supination     (Blank rows = not tested)  UPPER EXTREMITY MMT:  MMT Right 05/03/2022 Left 05/03/2022  Shoulder flexion 5/5 5/5  Shoulder extension    Shoulder abduction 5/5 5/5  Shoulder adduction    Shoulder extension    Shoulder internal rotation 5/5 5/5  Shoulder external rotation 4+/5 4+/5  Middle trapezius    Lower trapezius    Elbow flexion    Elbow extension    Wrist flexion    Wrist extension    Wrist ulnar deviation    Wrist radial deviation    Wrist pronation    Wrist supination    Grip strength     (Blank rows = not tested)  CERVICAL SPECIAL TESTS:  05/03/2022 No specific testing today   FUNCTIONAL TESTS:  05/03/2022 No specific testing today   TODAY'S TREATMENT: DATE:  05/13/2022 Therex: UBE fwd/back 3 mins each way lvl 2.5  Seated blue band bilateral UE ER c scapular retraction blue band x 10 Standing rows blue band x 15 Standing gh ext blue band x 15 Warrior pose at wall c thoracici rotation c horizontal abduction blue band x 10   Manual: Compression to bilateral upper trap.  cPA G4 T4-T9, regional PA G4 mobs to mid thoracic for mobility gains.   Trigger Point Dry-Needling  Treatment instructions: Expect mild to moderate muscle soreness. S/S of pneumothorax if dry needled over a lung field, and to seek immediate medical attention should they occur. Patient verbalized understanding of these instructions and education.  Patient Consent Given: Yes Education handout provided: Yes Muscles treated: bilateral upper trap  Treatment response/outcome: Concordant symptoms, fair tolerance,  no adverse reactions observed other  than typical soreness.   Moist heat to Rt upper trap during HEP education/performance.   DATE:  05/05/2022 TherEx UBE L3 x 4 min (2 min each direction) Review of HEP with min cues for technique, also discussed shoulder rolls and scap retraction with frequent position changes while at work  Manual STM with compression to bil upper trap and levator scapula; skilled palpation and monitoring of soft tissue during DN  Trigger Point Dry-Needling  Treatment instructions: Expect mild to moderate muscle soreness. S/S of pneumothorax if dry needled over a lung field, and to seek immediate medical attention should they occur. Patient verbalized understanding of these instructions and education.  Patient Consent Given: Yes Education handout provided: Previously provided Muscles treated: bil upper trap, bil levator scapula  Treatment response/outcome: twitch responses in all muscles; no adverse reactions observed other than typical soreness.    PATIENT EDUCATION:  05/13/2022 Education details: HEP update Person educated: Patient Education method: Consulting civil engineer, Demonstration, Verbal cues, and Handouts Education comprehension: verbalized understanding, returned demonstration, and verbal cues required  HOME EXERCISE PROGRAM: Access Code: [email or web address] URL: [email or web address] Date: 05/13/2022 Prepared by: Scot Jun  Exercises - Seated Upper Trapezius Stretch  - 3-5 x daily - 7 x weekly - 1 sets - 3 reps - 15 hold - Gentle Levator Scapulae Stretch  - 3-5 x daily - 7 x weekly - 1 sets - 3 reps - 15 hold - Shoulder External Rotation and Scapular Retraction with Resistance  - 1-2 x daily - 7 x weekly - 1-2 sets - 10-15 reps - Seated Cervical Retraction  - 1 x daily - 7 x weekly - 3 sets - 10 reps - Standing Shoulder Row with Anchored Resistance  - 1-2 x daily - 7 x weekly - 1-2 sets - 10-15 reps - Shoulder Extension with Resistance  - 1-2 x daily  - 7 x weekly - 1-2 sets - 10-15 reps - Standing Shoulder Horizontal Abduction with Resistance  - 1-2 x daily - 7 x weekly - 1-2 sets - 10-15 reps  ASSESSMENT:  CLINICAL IMPRESSION Continued good report of reduction of symptom severity with treatment to this point.  Progressed HEP to include further strengthening to help improve functional activity movement/tolerance. Continued skilled PT services indicated at this time.   OBJECTIVE IMPAIRMENTS: decreased endurance, decreased mobility, decreased ROM, decreased strength, increased fascial restrictions, impaired perceived functional ability, increased muscle spasms, impaired flexibility, impaired UE functional use, improper body mechanics, postural dysfunction, and pain.   ACTIVITY LIMITATIONS: carrying, lifting, bending, sitting, and sleeping  PARTICIPATION LIMITATIONS: interpersonal relationship, community activity, and occupation  PERSONAL FACTORS: No specific factors are also affecting patient's functional outcome.   REHAB POTENTIAL: Good  CLINICAL DECISION MAKING: Stable/uncomplicated  EVALUATION COMPLEXITY: Low   GOALS: Goals reviewed with patient? Yes  SHORT TERM GOALS: (target date for Short term goals are 3 weeks 05/24/2022)  1.Patient will demonstrate independent use of home exercise program to maintain progress from in clinic treatments. Goal status: Met 05/13/2022  LONG TERM GOALS: (target dates for all long term goals are 10 weeks  07/12/2022 )   1. Patient will demonstrate/report pain at worst less than or equal to 2/10 to facilitate minimal limitation in daily activity secondary to pain symptoms. Goal status: on going    2. Patient will demonstrate independent use of home exercise program to facilitate ability to maintain/progress functional gains from skilled physical therapy services. Goal status: on going    3. Patient will demonstrate FOTO outcome > or = 68 %  to indicate reduced disability due to condition. Goal  status: on going    4.  Patient will demonstrate cervical AROM WFL s symptoms to facilitate usual head movements for daily activity including driving, self care.   Goal status: on going    5.  Patient will demonstrate bilateral shoulder MMT 5/5 throughout to facilitate lifting, carrying at PLOF.   Goal status: on going    6.  Patient will demonstrate/report ability to sleep s restriction due to symptoms.  Goal status: on going     PLAN:  PT FREQUENCY: 1-2x/week  PT DURATION: 10 weeks  PLANNED INTERVENTIONS: Therapeutic exercises, Therapeutic activity, Neuro Muscular re-education, Balance training, Gait training, Patient/Family education, Joint mobilization, Stair training, DME instructions, Dry Needling, Electrical stimulation, Cryotherapy, vasopneumatic device,Traction, Moist heat, Taping, Ultrasound, Ionotophoresis '4mg'$ /ml Dexamethasone, and Manual therapy.  All included unless contraindicated  PLAN FOR NEXT SESSION: DN as appropriate.  Progressive strengthening program.  Check FOTO/UE strength if trending towards HEP transitioning.    Scot Jun, PT, DPT, OCS, ATC 05/13/22  12:16 PM

## 2022-05-17 ENCOUNTER — Encounter: Payer: Self-pay | Admitting: Rehabilitative and Restorative Service Providers"

## 2022-05-17 ENCOUNTER — Ambulatory Visit: Payer: BC Managed Care – PPO | Admitting: Rehabilitative and Restorative Service Providers"

## 2022-05-17 DIAGNOSIS — R293 Abnormal posture: Secondary | ICD-10-CM

## 2022-05-17 DIAGNOSIS — M542 Cervicalgia: Secondary | ICD-10-CM | POA: Diagnosis not present

## 2022-05-17 NOTE — Therapy (Addendum)
OUTPATIENT PHYSICAL THERAPY TREATMENT NOTE   Patient Name: Kelli Lewis MRN: [id number] DOB:1980/07/06, 42 y.o., female Today's Date: 05/17/2022  END OF SESSION:   PT End of Session - 05/17/22 1143     Visit Number 4    Number of Visits 20    Date for PT Re-Evaluation 07/12/22    Authorization Type [employer] $50 copay    Authorization - Number of Visits 30    PT Start Time 5809    PT Stop Time 1213    PT Time Calculation (min) 30 min    Activity Tolerance Patient tolerated treatment well    Behavior During Therapy [facility] for tasks assessed/performed               Past Medical History:  Diagnosis Date   Allergy    Anal fissure 01/04/2011   Anxiety    no per pt   Asthma    seasonal allergy induced does not use inhalers   Colon polyps    Colon ulcer    Crohn's disease ([place])    Diverticulosis    Duodenal diverticulum    Family history of malignant neoplasm of gastrointestinal tract    GERD (gastroesophageal reflux disease)    hx of with pregnancy   Headache    occas migraines   Heart murmur    Herpes    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[place])    right gluteal 2002-2003, melanoma   Rectal carcinoid tumor    Vitamin B12 deficiency    Vitamin D deficiency    Past Surgical History:  Procedure Laterality Date   COLONOSCOPY     EUS N/A 05/02/2014   Procedure: LOWER ENDOSCOPIC ULTRASOUND (EUS);  Surgeon: Milus Banister, MD;  Location: [facility];  Service: Endoscopy;  Laterality: N/A;   MELANOMA EXCISION     PARTIAL PROCTECTOMY BY TEM N/A 05/23/2014   Procedure: TEM PARTIAL PROCTECTOMY OF RECTAL MASS;  Surgeon:  [place], MD;  Location: [facility];  Service: General;  Laterality: N/A;   [place] EXTRACTION     Patient Active Problem List   Diagnosis Date Noted   Indication for care in labor or delivery 01/07/2018   NSVD (normal spontaneous vaginal delivery) 01/07/2018   Rectal carcinoid tumor 05/23/2014   Melanoma of skin, site unspecified 01/22/2013   IBD (inflammatory  bowel disease) 02/26/2011     THERAPY DIAG:  Cervicalgia  Abnormal posture   PCP: Lenard Simmer MD  REFERRING PROVIDER: Lorine Bears, NP  REFERRING DIAG: M54.2 (ICD-10-CM) - Cervicalgia M54.12 (ICD-10-CM) - Cervical radiculopathy  EVAL THERAPY DIAG:  Cervicalgia  Abnormal posture  Rationale for Evaluation and Treatment: Rehabilitation  ONSET DATE: Approx. 04/2021  SUBJECTIVE:  SUBJECTIVE STATEMENT: She indicated tightness as chief complaint since last visit, not really painful.  Reported Rt side was tighter than left today.   PERTINENT HISTORY:  GERD, Melanoma, Crohn's Disease.  History of injections to area.   PAIN:  NPRS scale: 1/10 Pain location: cervical pain, behind Rt eye mainly for headaches Pain description: sore, distracting, headaches Aggravating factors: work positioning, lifting, turning head Relieving factors: stretching, rest from work, tylenol, steriod pack  PRECAUTIONS: None  WEIGHT BEARING RESTRICTIONS: No  FALLS:  Has patient fallen in last 6 months? No  LIVING ENVIRONMENT: Lives with: lives with their family   OCCUPATION: Work requires sitting  PLOF: Independent, has 43 year old, reading, Rt handed   PATIENT GOALS: Reduce pain, improve sleep/positioning at work.    OBJECTIVE:   PATIENT SURVEYS:  05/17/2022:  FOTO update:  69  05/03/2022 FOTO intake: 61   predicted:  68  POSTURE: 05/03/2022 Mild forward head posture, rounded shoulders  PALPATION: 05/03/2022 Numerous trigger points bilateral upper trap , levator scap  PAIVM limitation in mid thoracic throughout for cPA   CERVICAL ROM:   ROM AROM (deg) 05/03/2022 AROM 05/13/2022  Flexion 65 c pain noted 62  Extension 89 85  Right lateral flexion    Left lateral flexion     Right rotation 85   Left rotation 85    (Blank rows = not tested)  UPPER EXTREMITY ROM:  ROM Right 05/03/2022 Left 05/03/2022  Shoulder flexion    Shoulder extension    Shoulder abduction    Shoulder adduction    Shoulder extension    Shoulder internal rotation    Shoulder external rotation    Elbow flexion    Elbow extension    Wrist flexion    Wrist extension    Wrist ulnar deviation    Wrist radial deviation    Wrist pronation    Wrist supination     (Blank rows = not tested)  UPPER EXTREMITY MMT:  MMT Right 05/03/2022 Left 05/03/2022  Shoulder flexion 5/5 5/5  Shoulder extension    Shoulder abduction 5/5 5/5  Shoulder adduction    Shoulder extension    Shoulder internal rotation 5/5 5/5  Shoulder external rotation 4+/5 4+/5  Middle trapezius    Lower trapezius    Elbow flexion    Elbow extension    Wrist flexion    Wrist extension    Wrist ulnar deviation    Wrist radial deviation    Wrist pronation    Wrist supination    Grip strength     (Blank rows = not tested)  CERVICAL SPECIAL TESTS:  05/03/2022 No specific testing today   FUNCTIONAL TESTS:  05/03/2022 No specific testing today   TODAY'S TREATMENT: DATE:  05/17/2022 Therex: UBE fwd/back 4 mins each way lvl 3 Seated upper trap stretch 15 sec off and on during 5 mins of heat, performed bilaterally  Manual: Compression to bilateral upper trap.  cPA G4 T4-T9, regional PA G4 mobs to mid thoracic for mobility gains.   Trigger Point Dry-Needling  Treatment instructions: Expect mild to moderate muscle soreness. S/S of pneumothorax if dry needled over a lung field, and to seek immediate medical attention should they occur. Patient verbalized understanding of these instructions and education.  Patient Consent Given: Yes Education handout provided: Yes Muscles treated: Rt upper trap  Treatment response/outcome: Concordant symptoms, fair tolerance, no adverse reactions observed other than typical  soreness.   Moist heat to Rt upper trap during stretching  DATE:  05/13/2022 Therex: UBE fwd/back 3 mins each way lvl 2.5  Seated blue band bilateral UE ER c scapular retraction blue band x 10 Standing rows blue band x 15 Standing gh ext blue band x 15 Warrior pose at wall c thoracici rotation c horizontal abduction blue band x 10   Manual: Compression to bilateral upper trap.  cPA G4 T4-T9, regional PA G4 mobs to mid thoracic for mobility gains.   Trigger Point Dry-Needling  Treatment instructions: Expect mild to moderate muscle soreness. S/S of pneumothorax if dry needled over a lung field, and to seek immediate medical attention should they occur. Patient verbalized understanding of these instructions and education.  Patient Consent Given: Yes Education handout provided: Yes Muscles treated: bilateral upper trap  Treatment response/outcome: Concordant symptoms, fair tolerance, no adverse reactions observed other than typical soreness.   Moist heat to Rt upper trap during HEP education/performance.   DATE:  05/05/2022 TherEx UBE L3 x 4 min (2 min each direction) Review of HEP with min cues for technique, also discussed shoulder rolls and scap retraction with frequent position changes while at work  Manual STM with compression to bil upper trap and levator scapula; skilled palpation and monitoring of soft tissue during DN  Trigger Point Dry-Needling  Treatment instructions: Expect mild to moderate muscle soreness. S/S of pneumothorax if dry needled over a lung field, and to seek immediate medical attention should they occur. Patient verbalized understanding of these instructions and education.  Patient Consent Given: Yes Education handout provided: Previously provided Muscles treated: bil upper trap, bil levator scapula  Treatment response/outcome: twitch responses in all muscles; no adverse reactions observed other than typical soreness.    PATIENT EDUCATION:   05/13/2022 Education details: HEP update Person educated: Patient Education method: Consulting civil engineer, Demonstration, Verbal cues, and Handouts Education comprehension: verbalized understanding, returned demonstration, and verbal cues required  HOME EXERCISE PROGRAM: Access Code: [email or web address] URL: [email or web address] Date: 05/13/2022 Prepared by: Scot Jun  Exercises - Seated Upper Trapezius Stretch  - 3-5 x daily - 7 x weekly - 1 sets - 3 reps - 15 hold - Gentle Levator Scapulae Stretch  - 3-5 x daily - 7 x weekly - 1 sets - 3 reps - 15 hold - Shoulder External Rotation and Scapular Retraction with Resistance  - 1-2 x daily - 7 x weekly - 1-2 sets - 10-15 reps - Seated Cervical Retraction  - 1 x daily - 7 x weekly - 3 sets - 10 reps - Standing Shoulder Row with Anchored Resistance  - 1-2 x daily - 7 x weekly - 1-2 sets - 10-15 reps - Shoulder Extension with Resistance  - 1-2 x daily - 7 x weekly - 1-2 sets - 10-15 reps - Standing Shoulder Horizontal Abduction with Resistance  - 1-2 x daily - 7 x weekly - 1-2 sets - 10-15 reps  ASSESSMENT:  CLINICAL IMPRESSION Continued reduced severity of symptoms indicated to this point.  FOTO reassessment showed improvement.   OBJECTIVE IMPAIRMENTS: decreased endurance, decreased mobility, decreased ROM, decreased strength, increased fascial restrictions, impaired perceived functional ability, increased muscle spasms, impaired flexibility, impaired UE functional use, improper body mechanics, postural dysfunction, and pain.   ACTIVITY LIMITATIONS: carrying, lifting, bending, sitting, and sleeping  PARTICIPATION LIMITATIONS: interpersonal relationship, community activity, and occupation  PERSONAL FACTORS: No specific factors are also affecting patient's functional outcome.   REHAB POTENTIAL: Good  CLINICAL DECISION MAKING: Stable/uncomplicated  EVALUATION COMPLEXITY: Low   GOALS: Goals reviewed with patient? Yes  SHORT  TERM  GOALS: (target date for Short term goals are 3 weeks 05/24/2022)  1.Patient will demonstrate independent use of home exercise program to maintain progress from in clinic treatments. Goal status: Met 05/13/2022  LONG TERM GOALS: (target dates for all long term goals are 10 weeks  07/12/2022 )   1. Patient will demonstrate/report pain at worst less than or equal to 2/10 to facilitate minimal limitation in daily activity secondary to pain symptoms. Goal status: on going    2. Patient will demonstrate independent use of home exercise program to facilitate ability to maintain/progress functional gains from skilled physical therapy services. Goal status: on going    3. Patient will demonstrate FOTO outcome > or = 68 % to indicate reduced disability due to condition. Goal status: on going    4.  Patient will demonstrate cervical AROM WFL s symptoms to facilitate usual head movements for daily activity including driving, self care.   Goal status: on going    5.  Patient will demonstrate bilateral shoulder MMT 5/5 throughout to facilitate lifting, carrying at PLOF.   Goal status: on going    6.  Patient will demonstrate/report ability to sleep s restriction due to symptoms.  Goal status: on going     PLAN:  PT FREQUENCY: 1-2x/week  PT DURATION: 10 weeks  PLANNED INTERVENTIONS: Therapeutic exercises, Therapeutic activity, Neuro Muscular re-education, Balance training, Gait training, Patient/Family education, Joint mobilization, Stair training, DME instructions, Dry Needling, Electrical stimulation, Cryotherapy, vasopneumatic device,Traction, Moist heat, Taping, Ultrasound, Ionotophoresis '4mg'$ /ml Dexamethasone, and Manual therapy.  All included unless contraindicated  PLAN FOR NEXT SESSION: LTG reassessment, possible discharge or trial HEP   Scot Jun, PT, DPT, OCS, ATC 05/17/22  12:22 PM

## 2022-05-20 ENCOUNTER — Ambulatory Visit: Payer: BC Managed Care – PPO | Admitting: Nurse Practitioner

## 2022-05-24 ENCOUNTER — Encounter: Payer: Self-pay | Admitting: Rehabilitative and Restorative Service Providers"

## 2022-05-24 ENCOUNTER — Ambulatory Visit: Payer: BC Managed Care – PPO | Admitting: Rehabilitative and Restorative Service Providers"

## 2022-05-24 DIAGNOSIS — M542 Cervicalgia: Secondary | ICD-10-CM | POA: Diagnosis not present

## 2022-05-24 DIAGNOSIS — R293 Abnormal posture: Secondary | ICD-10-CM | POA: Diagnosis not present

## 2022-05-24 NOTE — Therapy (Addendum)
OUTPATIENT PHYSICAL THERAPY TREATMENT NOTE   Patient Name: Kelli Lewis MRN: [id number] DOB:[birth year], 42 y.o., female Today's Date: 06/07/2022    05/24/22 1147  PT Visits / Re-Eval  Visit Number 5  Number of Visits 20  Date for PT Re-Evaluation 07/12/22  Authorization  Authorization Type [employer] $50 copay  Authorization - Number of Visits 30  PT Time Calculation  PT Start Time 1145  PT Stop Time 1212  PT Time Calculation (min) 27 min  PT - End of Session  Activity Tolerance Patient tolerated treatment well  Behavior During Therapy WFL for tasks assessed/performed     END OF SESSION:       Past Medical History:  Diagnosis Date   Allergy    Anal fissure 01/04/2011   Anxiety    no per pt   Asthma    seasonal allergy induced does not use inhalers   Colon polyps    Colon ulcer    Crohn's disease ([place])    Diverticulosis    Duodenal diverticulum    Family history of malignant neoplasm of gastrointestinal tract    GERD (gastroesophageal reflux disease)    hx of with pregnancy   Headache    occas migraines   Heart murmur    Herpes    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[place])    right gluteal 2002-2003, melanoma   Rectal carcinoid tumor    Vitamin B12 deficiency    Vitamin D deficiency    Past Surgical History:  Procedure Laterality Date   COLONOSCOPY     EUS N/A 05/02/2014   Procedure: LOWER ENDOSCOPIC ULTRASOUND (EUS);  Surgeon: Milus Banister, MD;  Location: [facility];  Service: Endoscopy;  Laterality: N/A;   MELANOMA EXCISION     PARTIAL PROCTECTOMY BY TEM N/A 05/23/2014   Procedure: TEM PARTIAL PROCTECTOMY OF RECTAL MASS;  Surgeon:  Boston, MD;  Location: [facility];  Service: General;  Laterality: N/A;   [place] EXTRACTION     Patient Active Problem List   Diagnosis Date Noted   Indication for care in labor or delivery 01/07/2018   NSVD (normal spontaneous vaginal delivery) 01/07/2018   Rectal carcinoid tumor 05/23/2014   Melanoma of skin, site unspecified  01/22/2013   IBD (inflammatory bowel disease) 02/26/2011     THERAPY DIAG:  Cervicalgia  Abnormal posture   PCP: Lenard Simmer MD  REFERRING PROVIDER: Lorine Bears, NP  REFERRING DIAG: M54.2 (ICD-10-CM) - Cervicalgia M54.12 (ICD-10-CM) - Cervical radiculopathy  EVAL THERAPY DIAG:  Cervicalgia  Abnormal posture  Rationale for Evaluation and Treatment: Rehabilitation  ONSET DATE: Approx. 04/2021  SUBJECTIVE:  SUBJECTIVE STATEMENT: She indicated feeling increase in Lt neck related complaints from Friday/Saturday with stiffness and pain.  She mentioned getting some relief c HEP but still troublesome.    PERTINENT HISTORY:  GERD, Melanoma, Crohn's Disease.  History of injections to area.   PAIN:  NPRS scale: 3/10 Pain location: Lt cervical/upper trap Pain description: sore, distracting, headaches Aggravating factors: work positioning, lifting, turning head Relieving factors: stretching, rest from work, tylenol, steriod pack  PRECAUTIONS: None  WEIGHT BEARING RESTRICTIONS: No  FALLS:  Has patient fallen in last 6 months? No  LIVING ENVIRONMENT: Lives with: lives with their family   OCCUPATION: Work requires sitting  PLOF: Independent, has 43 year old, reading, Rt handed   PATIENT GOALS: Reduce pain, improve sleep/positioning at work.    OBJECTIVE:   PATIENT SURVEYS:  05/17/2022:  FOTO update:  69  05/03/2022 FOTO intake: 61   predicted:  68  POSTURE: 05/03/2022 Mild forward head posture, rounded shoulders  PALPATION: 05/03/2022 Numerous trigger points bilateral upper trap , levator scap  PAIVM limitation in mid thoracic throughout for cPA   CERVICAL ROM:   ROM AROM (deg) 05/03/2022 AROM 05/13/2022 AROM 05/24/2022  Flexion 65 c pain noted 62 65   Extension 89 85   Right lateral flexion     Left lateral flexion     Right rotation 85    Left rotation 85     (Blank rows = not tested)  UPPER EXTREMITY ROM:  ROM Right 05/03/2022 Left 05/03/2022  Shoulder flexion    Shoulder extension    Shoulder abduction    Shoulder adduction    Shoulder extension    Shoulder internal rotation    Shoulder external rotation    Elbow flexion    Elbow extension    Wrist flexion    Wrist extension    Wrist ulnar deviation    Wrist radial deviation    Wrist pronation    Wrist supination     (Blank rows = not tested)  UPPER EXTREMITY MMT:  MMT Right 05/03/2022 Left 05/03/2022  Shoulder flexion 5/5 5/5  Shoulder extension    Shoulder abduction 5/5 5/5  Shoulder adduction    Shoulder extension    Shoulder internal rotation 5/5 5/5  Shoulder external rotation 4+/5 4+/5  Middle trapezius    Lower trapezius    Elbow flexion    Elbow extension    Wrist flexion    Wrist extension    Wrist ulnar deviation    Wrist radial deviation    Wrist pronation    Wrist supination    Grip strength     (Blank rows = not tested)  CERVICAL SPECIAL TESTS:  05/03/2022 No specific testing today   FUNCTIONAL TESTS:  05/03/2022 No specific testing today   TODAY'S TREATMENT: DATE:  05/24/2022 Therex: UBE fwd/back 3 mins each way lvl 3 Seated upper trap stretch 15 sec off and on during 5 mins of heat, performed bilaterally  Manual:  Compression to Lt upper trap.   Trigger Point Dry-Needling: Treatment instructions: Expect mild to moderate muscle soreness. S/S of pneumothorax if dry needled over a lung field, and to seek immediate medical attention should they occur. Patient verbalized understanding of these instructions and education.  Patient Consent Given: Yes Education handout provided: Yes Muscles treated: Rt upper trap  Treatment response/outcome: Concordant symptoms, fair tolerance, no adverse reactions observed other than typical soreness.    Moist heat 5 mins , added with stretching   DATE:  05/17/2022 Therex:  UBE fwd/back 4 mins each way lvl 3 Seated upper trap stretch 15 sec off and on during 5 mins of heat, performed bilaterally  Manual: Compression to bilateral upper trap.  cPA G4 T4-T9, regional PA G4 mobs to mid thoracic for mobility gains.   Trigger Point Dry-Needling  Treatment instructions: Expect mild to moderate muscle soreness. S/S of pneumothorax if dry needled over a lung field, and to seek immediate medical attention should they occur. Patient verbalized understanding of these instructions and education.  Patient Consent Given: Yes Education handout provided: Yes Muscles treated: Rt upper trap  Treatment response/outcome: Concordant symptoms, fair tolerance, no adverse reactions observed other than typical soreness.   Moist heat to Rt upper trap during stretching  DATE:  05/13/2022 Therex: UBE fwd/back 3 mins each way lvl 2.5  Seated blue band bilateral UE ER c scapular retraction blue band x 10 Standing rows blue band x 15 Standing gh ext blue band x 15 Warrior pose at wall c thoracici rotation c horizontal abduction blue band x 10   Manual: Compression to bilateral upper trap.  cPA G4 T4-T9, regional PA G4 mobs to mid thoracic for mobility gains.   Trigger Point Dry-Needling  Treatment instructions: Expect mild to moderate muscle soreness. S/S of pneumothorax if dry needled over a lung field, and to seek immediate medical attention should they occur. Patient verbalized understanding of these instructions and education.  Patient Consent Given: Yes Education handout provided: Yes Muscles treated: bilateral upper trap  Treatment response/outcome: Concordant symptoms, fair tolerance, no adverse reactions observed other than typical soreness.   Moist heat to Rt upper trap during HEP education/performance.   DATE:  05/05/2022 TherEx UBE L3 x 4 min (2 min each direction) Review of HEP with min  cues for technique, also discussed shoulder rolls and scap retraction with frequent position changes while at work  Manual STM with compression to bil upper trap and levator scapula; skilled palpation and monitoring of soft tissue during DN  Trigger Point Dry-Needling  Treatment instructions: Expect mild to moderate muscle soreness. S/S of pneumothorax if dry needled over a lung field, and to seek immediate medical attention should they occur. Patient verbalized understanding of these instructions and education.  Patient Consent Given: Yes Education handout provided: Previously provided Muscles treated: bil upper trap, bil levator scapula  Treatment response/outcome: twitch responses in all muscles; no adverse reactions observed other than typical soreness.    PATIENT EDUCATION:  05/13/2022 Education details: HEP update Person educated: Patient Education method: Consulting civil engineer, Demonstration, Verbal cues, and Handouts Education comprehension: verbalized understanding, returned demonstration, and verbal cues required  HOME EXERCISE PROGRAM: Access Code: [email or web address] URL: [email or web address] Date: 05/13/2022 Prepared by: Scot Jun  Exercises - Seated Upper Trapezius Stretch  - 3-5 x daily - 7 x weekly - 1 sets - 3 reps - 15 hold - Gentle Levator Scapulae Stretch  - 3-5 x daily - 7 x weekly - 1 sets - 3 reps - 15 hold - Shoulder External Rotation and Scapular Retraction with Resistance  - 1-2 x daily - 7 x weekly - 1-2 sets - 10-15 reps - Seated Cervical Retraction  - 1 x daily - 7 x weekly - 3 sets - 10 reps - Standing Shoulder Row with Anchored Resistance  - 1-2 x daily - 7 x weekly - 1-2 sets - 10-15 reps - Shoulder Extension with Resistance  - 1-2 x daily - 7 x weekly - 1-2 sets - 10-15 reps - Standing Shoulder Horizontal Abduction  with Resistance  - 1-2 x daily - 7 x weekly - 1-2 sets - 10-15 reps  ASSESSMENT:  CLINICAL IMPRESSION In general improvements are  there with some increase in lt upper trap symptoms noted since last visit.  Pt to benefit from continued skilled PT services for symptom relief c long term plan for HEP transitioning for self management.   OBJECTIVE IMPAIRMENTS: decreased endurance, decreased mobility, decreased ROM, decreased strength, increased fascial restrictions, impaired perceived functional ability, increased muscle spasms, impaired flexibility, impaired UE functional use, improper body mechanics, postural dysfunction, and pain.   ACTIVITY LIMITATIONS: carrying, lifting, bending, sitting, and sleeping  PARTICIPATION LIMITATIONS: interpersonal relationship, community activity, and occupation  PERSONAL FACTORS: No specific factors are also affecting patient's functional outcome.   REHAB POTENTIAL: Good  CLINICAL DECISION MAKING: Stable/uncomplicated  EVALUATION COMPLEXITY: Low   GOALS: Goals reviewed with patient? Yes  SHORT TERM GOALS: (target date for Short term goals are 3 weeks 05/24/2022)  1.Patient will demonstrate independent use of home exercise program to maintain progress from in clinic treatments. Goal status: Met 05/13/2022  LONG TERM GOALS: (target dates for all long term goals are 10 weeks  07/12/2022 )   1. Patient will demonstrate/report pain at worst less than or equal to 2/10 to facilitate minimal limitation in daily activity secondary to pain symptoms. Goal status: on going 05/24/2022   2. Patient will demonstrate independent use of home exercise program to facilitate ability to maintain/progress functional gains from skilled physical therapy services. Goal status: on going 05/24/2022   3. Patient will demonstrate FOTO outcome > or = 68 % to indicate reduced disability due to condition. Goal status: on going 05/24/2022   4.  Patient will demonstrate cervical AROM WFL s symptoms to facilitate usual head movements for daily activity including driving, self care.   Goal status: on going  05/24/2022   5.  Patient will demonstrate bilateral shoulder MMT 5/5 throughout to facilitate lifting, carrying at PLOF.   Goal status: on going 05/24/2022   6.  Patient will demonstrate/report ability to sleep s restriction due to symptoms.  Goal status: on going 05/24/2022    PLAN:  PT FREQUENCY: 1-2x/week  PT DURATION: 10 weeks  PLANNED INTERVENTIONS: Therapeutic exercises, Therapeutic activity, Neuro Muscular re-education, Balance training, Gait training, Patient/Family education, Joint mobilization, Stair training, DME instructions, Dry Needling, Electrical stimulation, Cryotherapy, vasopneumatic device,Traction, Moist heat, Taping, Ultrasound, Ionotophoresis 23m/ml Dexamethasone, and Manual therapy.  All included unless contraindicated  PLAN FOR NEXT SESSION:  possible discharge or trial HEP pending symptoms. Myofascial release as necessary   MScot Jun PT, DPT, OCS, ATC 06/07/22  11:26 AM

## 2022-05-27 ENCOUNTER — Other Ambulatory Visit: Payer: Self-pay

## 2022-05-27 ENCOUNTER — Telehealth: Payer: Self-pay | Admitting: Physician Assistant

## 2022-05-27 DIAGNOSIS — K523 Indeterminate colitis: Secondary | ICD-10-CM

## 2022-05-27 MED ORDER — MESALAMINE 1.2 G PO TBEC: 4.8000 g | DELAYED_RELEASE_TABLET | Freq: Every day | ORAL | 11 refills | Status: AC

## 2022-05-27 NOTE — Telephone Encounter (Signed)
Rx in January was refilled for 1 year. I have resubmitted the Rx for the patient

## 2022-05-27 NOTE — Telephone Encounter (Signed)
Inbound call from patient stating that the pharmacy has sent over a request for refill for Lialda and will not get her medication until it is filled out. Please advise.

## 2022-05-31 ENCOUNTER — Encounter: Payer: BC Managed Care – PPO | Admitting: Rehabilitative and Restorative Service Providers"

## 2022-05-31 NOTE — Therapy (Incomplete)
OUTPATIENT PHYSICAL THERAPY TREATMENT NOTE   Patient Name: Catalena Stanhope MRN: [id number] DOB:1980/10/25, 42 y.o., female Today's Date: 05/31/2022  END OF SESSION:       Past Medical History:  Diagnosis Date   Allergy    Anal fissure 01/04/2011   Anxiety    no per pt   Asthma    seasonal allergy induced does not use inhalers   Colon polyps    Colon ulcer    Crohn's disease ([place])    Diverticulosis    Duodenal diverticulum    Family history of malignant neoplasm of gastrointestinal tract    GERD (gastroesophageal reflux disease)    hx of with pregnancy   Headache    occas migraines   Heart murmur    Herpes    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[place])    right gluteal 2002-2003, melanoma   Rectal carcinoid tumor    Vitamin B12 deficiency    Vitamin D deficiency    Past Surgical History:  Procedure Laterality Date   COLONOSCOPY     EUS N/A 05/02/2014   Procedure: LOWER ENDOSCOPIC ULTRASOUND (EUS);  Surgeon: Milus Banister, MD;  Location: [facility];  Service: Endoscopy;  Laterality: N/A;   MELANOMA EXCISION     PARTIAL PROCTECTOMY BY TEM N/A 05/23/2014   Procedure: TEM PARTIAL PROCTECTOMY OF RECTAL MASS;  Surgeon:  Boston, MD;  Location: [facility];  Service: General;  Laterality: N/A;   WISDOM TOOTH EXTRACTION     Patient Active Problem List   Diagnosis Date Noted   Indication for care in labor or delivery 01/07/2018   NSVD (normal spontaneous vaginal delivery) 01/07/2018   Rectal carcinoid tumor 05/23/2014   Melanoma of skin, site unspecified 01/22/2013   IBD (inflammatory bowel disease) 02/26/2011     THERAPY DIAG:  No diagnosis found.   PCP: Lenard Simmer MD  REFERRING PROVIDER: Lorine Bears, NP  REFERRING DIAG: M54.2 (ICD-10-CM) - Cervicalgia M54.12 (ICD-10-CM) - Cervical radiculopathy  EVAL THERAPY DIAG:  Cervicalgia  Abnormal posture  Rationale for Evaluation and Treatment: Rehabilitation  ONSET DATE: Approx. 04/2021  SUBJECTIVE:                                                                                                                                                                                                          SUBJECTIVE STATEMENT: She indicated feeling increase in Lt neck related complaints from Friday/Saturday with stiffness and pain.  She mentioned getting some relief c HEP but still troublesome.    PERTINENT HISTORY:  GERD, Melanoma, Crohn's Disease.  History of injections to area.   PAIN:  NPRS scale: 3/10 Pain location: Lt cervical/upper trap Pain description: sore, distracting, headaches Aggravating factors: work positioning, lifting, turning head Relieving factors: stretching, rest from work, tylenol, steriod pack  PRECAUTIONS: None  WEIGHT BEARING RESTRICTIONS: No  FALLS:  Has patient fallen in last 6 months? No  LIVING ENVIRONMENT: Lives with: lives with their family   OCCUPATION: Work requires sitting  PLOF: Independent, has 43 year old, reading, Rt handed   PATIENT GOALS: Reduce pain, improve sleep/positioning at work.    OBJECTIVE:   PATIENT SURVEYS:  05/17/2022:  FOTO update:  69  05/03/2022 FOTO intake: 61   predicted:  68  POSTURE: 05/03/2022 Mild forward head posture, rounded shoulders  PALPATION: 05/03/2022 Numerous trigger points bilateral upper trap , levator scap  PAIVM limitation in mid thoracic throughout for cPA   CERVICAL ROM:   ROM AROM (deg) 05/03/2022 AROM 05/13/2022 AROM 05/24/2022  Flexion 65 c pain noted 62 65  Extension 89 85   Right lateral flexion     Left lateral flexion     Right rotation 85    Left rotation 85     (Blank rows = not tested)  UPPER EXTREMITY ROM:  ROM Right 05/03/2022 Left 05/03/2022  Shoulder flexion    Shoulder extension    Shoulder abduction    Shoulder adduction    Shoulder extension    Shoulder internal rotation    Shoulder external rotation    Elbow flexion    Elbow extension    Wrist flexion    Wrist extension     Wrist ulnar deviation    Wrist radial deviation    Wrist pronation    Wrist supination     (Blank rows = not tested)  UPPER EXTREMITY MMT:  MMT Right 05/03/2022 Left 05/03/2022  Shoulder flexion 5/5 5/5  Shoulder extension    Shoulder abduction 5/5 5/5  Shoulder adduction    Shoulder extension    Shoulder internal rotation 5/5 5/5  Shoulder external rotation 4+/5 4+/5  Middle trapezius    Lower trapezius    Elbow flexion    Elbow extension    Wrist flexion    Wrist extension    Wrist ulnar deviation    Wrist radial deviation    Wrist pronation    Wrist supination    Grip strength     (Blank rows = not tested)  CERVICAL SPECIAL TESTS:  05/03/2022 No specific testing today   FUNCTIONAL TESTS:  05/03/2022 No specific testing today   TODAY'S TREATMENT: DATE:  05/24/2022 Therex: UBE fwd/back 3 mins each way lvl 3 Seated upper trap stretch 15 sec off and on during 5 mins of heat, performed bilaterally  Manual:  Compression to Lt upper trap.   Trigger Point Dry-Needling: Treatment instructions: Expect mild to moderate muscle soreness. S/S of pneumothorax if dry needled over a lung field, and to seek immediate medical attention should they occur. Patient verbalized understanding of these instructions and education.  Patient Consent Given: Yes Education handout provided: Yes Muscles treated: Rt upper trap  Treatment response/outcome: Concordant symptoms, fair tolerance, no adverse reactions observed other than typical soreness.   Moist heat 5 mins , added with stretching   DATE:  05/17/2022 Therex: UBE fwd/back 4 mins each way lvl 3 Seated upper trap stretch 15 sec off and on during 5 mins of heat, performed bilaterally  Manual: Compression to bilateral upper trap.  cPA G4 T4-T9, regional PA G4 mobs to  mid thoracic for mobility gains.   Trigger Point Dry-Needling  Treatment instructions: Expect mild to moderate muscle soreness. S/S of pneumothorax if dry needled over  a lung field, and to seek immediate medical attention should they occur. Patient verbalized understanding of these instructions and education.  Patient Consent Given: Yes Education handout provided: Yes Muscles treated: Rt upper trap  Treatment response/outcome: Concordant symptoms, fair tolerance, no adverse reactions observed other than typical soreness.   Moist heat to Rt upper trap during stretching  DATE:  05/13/2022 Therex: UBE fwd/back 3 mins each way lvl 2.5  Seated blue band bilateral UE ER c scapular retraction blue band x 10 Standing rows blue band x 15 Standing gh ext blue band x 15 Warrior pose at wall c thoracici rotation c horizontal abduction blue band x 10   Manual: Compression to bilateral upper trap.  cPA G4 T4-T9, regional PA G4 mobs to mid thoracic for mobility gains.   Trigger Point Dry-Needling  Treatment instructions: Expect mild to moderate muscle soreness. S/S of pneumothorax if dry needled over a lung field, and to seek immediate medical attention should they occur. Patient verbalized understanding of these instructions and education.  Patient Consent Given: Yes Education handout provided: Yes Muscles treated: bilateral upper trap  Treatment response/outcome: Concordant symptoms, fair tolerance, no adverse reactions observed other than typical soreness.   Moist heat to Rt upper trap during HEP education/performance.   DATE:  05/05/2022 TherEx UBE L3 x 4 min (2 min each direction) Review of HEP with min cues for technique, also discussed shoulder rolls and scap retraction with frequent position changes while at work  Manual STM with compression to bil upper trap and levator scapula; skilled palpation and monitoring of soft tissue during DN  Trigger Point Dry-Needling  Treatment instructions: Expect mild to moderate muscle soreness. S/S of pneumothorax if dry needled over a lung field, and to seek immediate medical attention should they occur. Patient  verbalized understanding of these instructions and education.  Patient Consent Given: Yes Education handout provided: Previously provided Muscles treated: bil upper trap, bil levator scapula  Treatment response/outcome: twitch responses in all muscles; no adverse reactions observed other than typical soreness.    PATIENT EDUCATION:  05/13/2022 Education details: HEP update Person educated: Patient Education method: Consulting civil engineer, Demonstration, Verbal cues, and Handouts Education comprehension: verbalized understanding, returned demonstration, and verbal cues required  HOME EXERCISE PROGRAM: Access Code: [email or web address] URL: [email or web address] Date: 05/13/2022 Prepared by: Scot Jun  Exercises - Seated Upper Trapezius Stretch  - 3-5 x daily - 7 x weekly - 1 sets - 3 reps - 15 hold - Gentle Levator Scapulae Stretch  - 3-5 x daily - 7 x weekly - 1 sets - 3 reps - 15 hold - Shoulder External Rotation and Scapular Retraction with Resistance  - 1-2 x daily - 7 x weekly - 1-2 sets - 10-15 reps - Seated Cervical Retraction  - 1 x daily - 7 x weekly - 3 sets - 10 reps - Standing Shoulder Row with Anchored Resistance  - 1-2 x daily - 7 x weekly - 1-2 sets - 10-15 reps - Shoulder Extension with Resistance  - 1-2 x daily - 7 x weekly - 1-2 sets - 10-15 reps - Standing Shoulder Horizontal Abduction with Resistance  - 1-2 x daily - 7 x weekly - 1-2 sets - 10-15 reps  ASSESSMENT:  CLINICAL IMPRESSION In general improvements are there with some increase in lt upper trap symptoms noted since last visit.  Pt to benefit from continued skilled PT services for symptom relief c long term plan for HEP transitioning for self management.   OBJECTIVE IMPAIRMENTS: decreased endurance, decreased mobility, decreased ROM, decreased strength, increased fascial restrictions, impaired perceived functional ability, increased muscle spasms, impaired flexibility, impaired UE functional use, improper  body mechanics, postural dysfunction, and pain.   ACTIVITY LIMITATIONS: carrying, lifting, bending, sitting, and sleeping  PARTICIPATION LIMITATIONS: interpersonal relationship, community activity, and occupation  PERSONAL FACTORS: No specific factors are also affecting patient's functional outcome.   REHAB POTENTIAL: Good  CLINICAL DECISION MAKING: Stable/uncomplicated  EVALUATION COMPLEXITY: Low   GOALS: Goals reviewed with patient? Yes  SHORT TERM GOALS: (target date for Short term goals are 3 weeks 05/24/2022)  1.Patient will demonstrate independent use of home exercise program to maintain progress from in clinic treatments. Goal status: Met 05/13/2022  LONG TERM GOALS: (target dates for all long term goals are 10 weeks  07/12/2022 )   1. Patient will demonstrate/report pain at worst less than or equal to 2/10 to facilitate minimal limitation in daily activity secondary to pain symptoms. Goal status: on going 05/24/2022   2. Patient will demonstrate independent use of home exercise program to facilitate ability to maintain/progress functional gains from skilled physical therapy services. Goal status: on going 05/24/2022   3. Patient will demonstrate FOTO outcome > or = 68 % to indicate reduced disability due to condition. Goal status: on going 05/24/2022   4.  Patient will demonstrate cervical AROM WFL s symptoms to facilitate usual head movements for daily activity including driving, self care.   Goal status: on going 05/24/2022   5.  Patient will demonstrate bilateral shoulder MMT 5/5 throughout to facilitate lifting, carrying at PLOF.   Goal status: on going 05/24/2022   6.  Patient will demonstrate/report ability to sleep s restriction due to symptoms.  Goal status: on going 05/24/2022    PLAN:  PT FREQUENCY: 1-2x/week  PT DURATION: 10 weeks  PLANNED INTERVENTIONS: Therapeutic exercises, Therapeutic activity, Neuro Muscular re-education, Balance training, Gait  training, Patient/Family education, Joint mobilization, Stair training, DME instructions, Dry Needling, Electrical stimulation, Cryotherapy, vasopneumatic device,Traction, Moist heat, Taping, Ultrasound, Ionotophoresis '4mg'$ /ml Dexamethasone, and Manual therapy.  All included unless contraindicated  PLAN FOR NEXT SESSION:  possible discharge or trial HEP pending symptoms. Myofascial release as necessary   Scot Jun, PT, DPT, OCS, ATC 05/31/22  8:44 AM

## 2022-06-30 ENCOUNTER — Ambulatory Visit: Payer: BC Managed Care – PPO | Admitting: Rehabilitative and Restorative Service Providers"

## 2022-06-30 ENCOUNTER — Encounter: Payer: Self-pay | Admitting: Rehabilitative and Restorative Service Providers"

## 2022-06-30 DIAGNOSIS — M542 Cervicalgia: Secondary | ICD-10-CM

## 2022-06-30 DIAGNOSIS — R293 Abnormal posture: Secondary | ICD-10-CM

## 2022-06-30 NOTE — Therapy (Signed)
OUTPATIENT PHYSICAL THERAPY TREATMENT NOTE   Patient Name: Kelli Lewis MRN: [id number] DOB:05/04/1980, 42 y.o., female Today's Date: 06/30/2022  END OF SESSION:   PT End of Session - 06/30/22 1432     Visit Number 6    Number of Visits 20    Date for PT Re-Evaluation 07/12/22    Authorization Type [employer] $50 copay    Authorization - Number of Visits 30    PT Start Time 1430    PT Stop Time 1500    PT Time Calculation (min) 30 min    Activity Tolerance Patient tolerated treatment well    Behavior During Therapy [facility] for tasks assessed/performed                Past Medical History:  Diagnosis Date   Allergy    Anal fissure 01/04/2011   Anxiety    no per pt   Asthma    seasonal allergy induced does not use inhalers   Colon polyps    Colon ulcer    Crohn's disease ([place])    Diverticulosis    Duodenal diverticulum    Family history of malignant neoplasm of gastrointestinal tract    GERD (gastroesophageal reflux disease)    hx of with pregnancy   Headache    occas migraines   Heart murmur    Herpes    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[place])    right gluteal 2002-2003, melanoma   Rectal carcinoid tumor    Vitamin B12 deficiency    Vitamin D deficiency    Past Surgical History:  Procedure Laterality Date   COLONOSCOPY     EUS N/A 05/02/2014   Procedure: LOWER ENDOSCOPIC ULTRASOUND (EUS);  Surgeon: Milus Banister, MD;  Location: [facility];  Service: Endoscopy;  Laterality: N/A;   MELANOMA EXCISION     PARTIAL PROCTECTOMY BY TEM N/A 05/23/2014   Procedure: TEM PARTIAL PROCTECTOMY OF RECTAL MASS;  Surgeon:  Boston, MD;  Location: [facility];  Service: General;  Laterality: N/A;   [place] EXTRACTION     Patient Active Problem List   Diagnosis Date Noted   Indication for care in labor or delivery 01/07/2018   NSVD (normal spontaneous vaginal delivery) 01/07/2018   Rectal carcinoid tumor 05/23/2014   Melanoma of skin, site unspecified 01/22/2013   IBD (inflammatory  bowel disease) 02/26/2011     THERAPY DIAG:  Cervicalgia  Abnormal posture   PCP: Lenard Simmer MD  REFERRING PROVIDER: Lorine Bears, NP  REFERRING DIAG: M54.2 (ICD-10-CM) - Cervicalgia M54.12 (ICD-10-CM) - Cervical radiculopathy  EVAL THERAPY DIAG:  Cervicalgia  Abnormal posture  Rationale for Evaluation and Treatment: Rehabilitation  ONSET DATE: Approx. 04/2021  SUBJECTIVE:  SUBJECTIVE STATEMENT: She indicated doing pretty good for most of Feb but last 2 weeks had increased pain in Lt and Rt upper trap and headaches behind eyes.  Better in morning and worse during the day with some trouble sleeping.    PERTINENT HISTORY:  GERD, Melanoma, Crohn's Disease.  History of injections to area.   PAIN:  NPRS scale: 5/10 Pain location: bilateral upper trap Pain description: sore, distracting, headaches Aggravating factors: work positioning, lifting, turning head Relieving factors: stretching, rest from work, tylenol, steriod pack  PRECAUTIONS: None  WEIGHT BEARING RESTRICTIONS: No  FALLS:  Has patient fallen in last 6 months? No  LIVING ENVIRONMENT: Lives with: lives with their family   OCCUPATION: Work requires sitting  PLOF: Independent, has 43 year old, reading, Rt handed   PATIENT GOALS: Reduce pain, improve sleep/positioning at work.    OBJECTIVE:   PATIENT SURVEYS:  05/17/2022:  FOTO update:  69  05/03/2022 FOTO intake: 61   predicted:  68  POSTURE: 05/03/2022 Mild forward head posture, rounded shoulders  PALPATION: 05/03/2022 Numerous trigger points bilateral upper trap , levator scap  PAIVM limitation in mid thoracic throughout for cPA   CERVICAL ROM:   ROM AROM (deg) 05/03/2022 AROM 05/13/2022 AROM 05/24/2022 AROM 06/30/2022  Flexion 65 c  pain noted 62 65 62 c pain  Extension 89 85  70  Right lateral flexion      Left lateral flexion      Right rotation 85   85  Left rotation 85   90   (Blank rows = not tested)  UPPER EXTREMITY ROM:  ROM Right 05/03/2022 Left 05/03/2022  Shoulder flexion    Shoulder extension    Shoulder abduction    Shoulder adduction    Shoulder extension    Shoulder internal rotation    Shoulder external rotation    Elbow flexion    Elbow extension    Wrist flexion    Wrist extension    Wrist ulnar deviation    Wrist radial deviation    Wrist pronation    Wrist supination     (Blank rows = not tested)  UPPER EXTREMITY MMT:  MMT Right 05/03/2022 Left 05/03/2022 Right 06/30/2022 Left 06/30/2022  Shoulder flexion 5/5 5/5 5/5 5/5  Shoulder extension      Shoulder abduction 5/5 5/5 5/5 5/5  Shoulder adduction      Shoulder extension      Shoulder internal rotation 5/5 5/5 5/5 5/5  Shoulder external rotation 4+/5 4+/5 5/5 5/5  Middle trapezius      Lower trapezius      Elbow flexion      Elbow extension      Wrist flexion      Wrist extension      Wrist ulnar deviation      Wrist radial deviation      Wrist pronation      Wrist supination      Grip strength       (Blank rows = not tested)  CERVICAL SPECIAL TESTS:  05/03/2022 No specific testing today   FUNCTIONAL TESTS:  05/03/2022 No specific testing today   TODAY'S TREATMENT: DATE:  06/30/2022 Therex: UBE fwd/back 3 mins each way lvl 3 Seated upper trap stretch 15 sec off and on during 5 mins of heat, performed bilaterally  Manual:  Compression to bilateral upper trap, g4 mobs mid thoracic cPA, regional PA  Trigger Point Dry-Needling: Treatment instructions: Expect mild to moderate muscle soreness. S/S of pneumothorax if  dry needled over a lung field, and to seek immediate medical attention should they occur. Patient verbalized understanding of these instructions and education.  Patient Consent Given: Yes Education handout  provided: Yes Muscles treated: Rt upper trap  Treatment response/outcome: Concordant symptoms, fair tolerance, no adverse reactions observed other than typical soreness.   Moist heat 5 mins , added with stretching   DATE:  05/24/2022 Therex: Verbal review of existing HEP for knowledge and consistency encouragement.  UBE fwd/back 4 mins each way lvl 3 Seated upper trap stretch 15 sec x 2 bilateral Seated scapular retraction c bilateral UE ER blue band x 10   Manual:  Compression to Lt upper trap.   Trigger Point Dry-Needling: Treatment instructions: Expect mild to moderate muscle soreness. S/S of pneumothorax if dry needled over a lung field, and to seek immediate medical attention should they occur. Patient verbalized understanding of these instructions and education.  Patient Consent Given: Yes Education handout provided: Yes Muscles treated: bilateral upper trap  Treatment response/outcome: Concordant symptoms, fair tolerance, no adverse reactions observed other than typical soreness.    DATE:  05/17/2022 Therex: UBE fwd/back 4 mins each way lvl 3 Seated upper trap stretch 15 sec off and on during 5 mins of heat, performed bilaterally  Manual: Compression to bilateral upper trap.  cPA G4 T4-T9, regional PA G4 mobs to mid thoracic for mobility gains.   Trigger Point Dry-Needling  Treatment instructions: Expect mild to moderate muscle soreness. S/S of pneumothorax if dry needled over a lung field, and to seek immediate medical attention should they occur. Patient verbalized understanding of these instructions and education.  Patient Consent Given: Yes Education handout provided: Yes Muscles treated: Rt upper trap  Treatment response/outcome: Concordant symptoms, fair tolerance, no adverse reactions observed other than typical soreness.   Moist heat to Rt upper trap during stretching  DATE:  05/13/2022 Therex: UBE fwd/back 3 mins each way lvl 2.5  Seated blue band  bilateral UE ER c scapular retraction blue band x 10 Standing rows blue band x 15 Standing gh ext blue band x 15 Warrior pose at wall c thoracici rotation c horizontal abduction blue band x 10   Manual: Compression to bilateral upper trap.  cPA G4 T4-T9, regional PA G4 mobs to mid thoracic for mobility gains.   Trigger Point Dry-Needling  Treatment instructions: Expect mild to moderate muscle soreness. S/S of pneumothorax if dry needled over a lung field, and to seek immediate medical attention should they occur. Patient verbalized understanding of these instructions and education.  Patient Consent Given: Yes Education handout provided: Yes Muscles treated: bilateral upper trap  Treatment response/outcome: Concordant symptoms, fair tolerance, no adverse reactions observed other than typical soreness.   Moist heat to Rt upper trap during HEP education/performance.    PATIENT EDUCATION:  05/13/2022 Education details: HEP update Person educated: Patient Education method: Consulting civil engineer, Demonstration, Verbal cues, and Handouts Education comprehension: verbalized understanding, returned demonstration, and verbal cues required  HOME EXERCISE PROGRAM: Access Code: [email or web address] URL: [email or web address] Date: 05/13/2022 Prepared by: Scot Jun  Exercises - Seated Upper Trapezius Stretch  - 3-5 x daily - 7 x weekly - 1 sets - 3 reps - 15 hold - Gentle Levator Scapulae Stretch  - 3-5 x daily - 7 x weekly - 1 sets - 3 reps - 15 hold - Shoulder External Rotation and Scapular Retraction with Resistance  - 1-2 x daily - 7 x weekly - 1-2 sets - 10-15 reps - Seated Cervical Retraction  -  1 x daily - 7 x weekly - 3 sets - 10 reps - Standing Shoulder Row with Anchored Resistance  - 1-2 x daily - 7 x weekly - 1-2 sets - 10-15 reps - Shoulder Extension with Resistance  - 1-2 x daily - 7 x weekly - 1-2 sets - 10-15 reps - Standing Shoulder Horizontal Abduction with Resistance  - 1-2 x  daily - 7 x weekly - 1-2 sets - 10-15 reps  ASSESSMENT:  CLINICAL IMPRESSION Pt returned c increased symptoms in last 2 weeks c insidious worsening.  Similar overall presentation noted with cervical pain c headaches and myofascial trigger points.  Pt to benefit from continued skilled PT services to address impairments.   OBJECTIVE IMPAIRMENTS: decreased endurance, decreased mobility, decreased ROM, decreased strength, increased fascial restrictions, impaired perceived functional ability, increased muscle spasms, impaired flexibility, impaired UE functional use, improper body mechanics, postural dysfunction, and pain.   ACTIVITY LIMITATIONS: carrying, lifting, bending, sitting, and sleeping  PARTICIPATION LIMITATIONS: interpersonal relationship, community activity, and occupation  PERSONAL FACTORS: No specific factors are also affecting patient's functional outcome.   REHAB POTENTIAL: Good  CLINICAL DECISION MAKING: Stable/uncomplicated  EVALUATION COMPLEXITY: Low   GOALS: Goals reviewed with patient? Yes  SHORT TERM GOALS: (target date for Short term goals are 3 weeks 05/24/2022)  1.Patient will demonstrate independent use of home exercise program to maintain progress from in clinic treatments. Goal status: Met 05/13/2022  LONG TERM GOALS: (target dates for all long term goals are 10 weeks  07/12/2022 )   1. Patient will demonstrate/report pain at worst less than or equal to 2/10 to facilitate minimal limitation in daily activity secondary to pain symptoms. Goal status: on going 06/30/2022   2. Patient will demonstrate independent use of home exercise program to facilitate ability to maintain/progress functional gains from skilled physical therapy services. Goal status: on going 06/30/2022   3. Patient will demonstrate FOTO outcome > or = 68 % to indicate reduced disability due to condition. Goal status: on going 06/30/2022   4.  Patient will demonstrate cervical AROM WFL s symptoms  to facilitate usual head movements for daily activity including driving, self care.   Goal status: on going 06/30/2022   5.  Patient will demonstrate bilateral shoulder MMT 5/5 throughout to facilitate lifting, carrying at PLOF.   Goal status: on going 06/30/2022   6.  Patient will demonstrate/report ability to sleep s restriction due to symptoms.  Goal status: on going 06/30/2022    PLAN:  PT FREQUENCY: 1-2x/week  PT DURATION: 10 weeks  PLANNED INTERVENTIONS: Therapeutic exercises, Therapeutic activity, Neuro Muscular re-education, Balance training, Gait training, Patient/Family education, Joint mobilization, Stair training, DME instructions, Dry Needling, Electrical stimulation, Cryotherapy, vasopneumatic device,Traction, Moist heat, Taping, Ultrasound, Ionotophoresis '4mg'$ /ml Dexamethasone, and Manual therapy.  All included unless contraindicated  PLAN FOR NEXT SESSION:  Recert on [id number] required.  Continued trigger point release as tolerated.    Scot Jun, PT, DPT, OCS, ATC 06/30/22  3:01 PM

## 2022-07-02 ENCOUNTER — Encounter: Payer: Self-pay | Admitting: Internal Medicine

## 2022-07-02 ENCOUNTER — Ambulatory Visit: Payer: BC Managed Care – PPO | Admitting: Internal Medicine

## 2022-07-02 VITALS — BP 124/72 | HR 78 | Ht 64.0 in | Wt 155.1 lb

## 2022-07-02 DIAGNOSIS — Z86010 Personal history of colonic polyps: Secondary | ICD-10-CM | POA: Diagnosis not present

## 2022-07-02 DIAGNOSIS — K523 Indeterminate colitis: Secondary | ICD-10-CM

## 2022-07-02 DIAGNOSIS — Z86012 Personal history of benign carcinoid tumor: Secondary | ICD-10-CM | POA: Diagnosis not present

## 2022-07-02 DIAGNOSIS — E559 Vitamin D deficiency, unspecified: Secondary | ICD-10-CM

## 2022-07-02 DIAGNOSIS — E538 Deficiency of other specified B group vitamins: Secondary | ICD-10-CM | POA: Diagnosis not present

## 2022-07-02 NOTE — Progress Notes (Signed)
   Subjective:    Patient ID: Kelli Lewis, female    DOB: Dec 12, 1980, 42 y.o.   MRN: [id number]  HPI Kelli Lewis is a 42 year old female with a history of indeterminate colitis, rectal carcinoid status post surgical resection (2016), GERD, B12 and vitamin D deficiency who is here for follow-up.  She is here alone today and was last seen on 05/07/2022 by Nicoletta Ba, PA-C.  She had a flare of her colitis symptoms in early January.  She actually became quite constipated with abdominal bloating diffuse abdominal pain but also some bleeding with bowel movement.  She has been restarted on Lialda 4.8 g/day of symptoms have improved dramatically.  Bowel movements are now back to being more regular 2-3 times a day with resolution of blood with stool and abdominal pain.  Review of Systems As per HPI, otherwise negative  Current Medications, Allergies, Past Medical History, Past Surgical History, Family History and Social History were reviewed in [employer] record.    Objective:   Physical Exam BP 124/72   Pulse 78   Ht 5\' 4"  (1.626 m)   Wt 155 lb 2 oz (70.4 kg)   BMI 26.63 kg/m  Gen: awake, alert, NAD HEENT: anicteric  Abd: soft, diffusely tender but without rebound or guarding, nondistended, +BS throughout Ext: no c/c/e Neuro: nonfocal  Fecal calprotectin less than 5 CRP in January 12.23  (interestingly it was the same 8 years previously) ESR 16 White count 10.4, hemoglobin 14.4, platelet count 377     Assessment & Plan:  42 year old female with a history of indeterminate colitis, rectal carcinoid status post surgical resection (2016), GERD, B12 and vitamin D deficiency who is here for follow-up.   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  Her symptoms have always responded well to 5-ASA but fortunately she has been largely symptom-free most of the time having only "flares" 1 or 2 times a year responded quickly to  the [place].  She had such a flare possibly more intense than normal earlier this year but now back to clinical remission.  Interestingly the fecal calprotectin was very low.  Could this possibly have been constipation alone? --For now continue Lialda but reduced to 2.4 g daily for the rest of March; stop Lialda entirely around April 1; it can be resumed if colitis symptoms recur at 2.4 g daily.  2.  History of adenomatous colon polyp and rectal carcinoid --rectal carcinoid resected fully; adenoma surveillance colonoscopy recommended next year -- Surveillance colonoscopy 2025  3.  History of B12 and vitamin D deficiency --she feels like Dr. Virgina Jock may have recently checked her B12 and vitamin D level.  I asked that she check her patient portal to see if these were done if not we can draw them here -- Vitamin D and B12 levels should be checked she will check to see if this was done recently primary care

## 2022-07-02 NOTE — Patient Instructions (Addendum)
Decrease mesalamine to 2.4 grams daily. You may discontinue this medication starting 07/26/22.  Please follow up with Dr Hilarie Fredrickson in 1 year.  Check your lab portal with Dr Virgina Jock and make sure you have had a B12 and vitamin d level checked recently.  _______________________________________________________  If your blood pressure at your visit was 140/90 or greater, please contact your primary care physician to follow up on this.  _______________________________________________________  If you are age 74 or older, your body mass index should be between 23-30. Your Body mass index is 26.63 kg/m. If this is out of the aforementioned range listed, please consider follow up with your Primary Care Provider.  If you are age 53 or younger, your body mass index should be between 19-25. Your Body mass index is 26.63 kg/m. If this is out of the aformentioned range listed, please consider follow up with your Primary Care Provider.   ________________________________________________________  The [facility] providers would like to encourage you to use [facility] to communicate with providers for non-urgent requests or questions.  Due to long hold times on the telephone, sending your provider a message by [facility] may be a faster and more efficient way to get a response.  Please allow 48 business hours for a response.  Please remember that this is for non-urgent requests.  _______________________________________________________  Due to recent changes in healthcare laws, you may see the results of your imaging and laboratory studies on MyChart before your provider has had a chance to review them.  We understand that in some cases there may be results that are confusing or concerning to you. Not all laboratory results come back in the same time frame and the provider may be waiting for multiple results in order to interpret others.  Please give [place] 48 hours in order for your provider to thoroughly review all the results  before contacting the office for clarification of your results.

## 2022-07-05 ENCOUNTER — Ambulatory Visit: Payer: BC Managed Care – PPO | Admitting: Rehabilitative and Restorative Service Providers"

## 2022-07-05 ENCOUNTER — Encounter: Payer: Self-pay | Admitting: Rehabilitative and Restorative Service Providers"

## 2022-07-05 ENCOUNTER — Encounter: Payer: BC Managed Care – PPO | Admitting: Rehabilitative and Restorative Service Providers"

## 2022-07-05 DIAGNOSIS — M542 Cervicalgia: Secondary | ICD-10-CM | POA: Diagnosis not present

## 2022-07-05 DIAGNOSIS — R293 Abnormal posture: Secondary | ICD-10-CM | POA: Diagnosis not present

## 2022-07-05 NOTE — Therapy (Signed)
OUTPATIENT PHYSICAL THERAPY TREATMENT NOTE   Patient Name: Janille Shahid MRN: [id number] DOB:[birth year], 42 y.o., female Today's Date: 07/05/2022  END OF SESSION:   PT End of Session - 07/05/22 1212     Visit Number 7    Number of Visits 20    Date for PT Re-Evaluation 07/12/22    Authorization Type [employer] $50 copay    Authorization - Number of Visits 30    PT Start Time 1147    PT Stop Time 1217    PT Time Calculation (min) 30 min    Activity Tolerance Patient tolerated treatment well    Behavior During Therapy [facility] for tasks assessed/performed                 Past Medical History:  Diagnosis Date   Allergy    Anal fissure 01/04/2011   Anxiety    no per pt   Asthma    seasonal allergy induced does not use inhalers   Colon polyps    Colon ulcer    Crohn's disease ([place])    Diverticulosis    Duodenal diverticulum    Family history of malignant neoplasm of gastrointestinal tract    GERD (gastroesophageal reflux disease)    hx of with pregnancy   Headache    occas migraines   Heart murmur    Herpes    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[place])    right gluteal 2002-2003, melanoma   Rectal carcinoid tumor    Vitamin B12 deficiency    Vitamin D deficiency    Past Surgical History:  Procedure Laterality Date   COLONOSCOPY     EUS N/A 05/02/2014   Procedure: LOWER ENDOSCOPIC ULTRASOUND (EUS);  Surgeon: Milus Banister, MD;  Location: [facility];  Service: Endoscopy;  Laterality: N/A;   MELANOMA EXCISION     PARTIAL PROCTECTOMY BY TEM N/A 05/23/2014   Procedure: TEM PARTIAL PROCTECTOMY OF RECTAL MASS;  Surgeon:  Boston, MD;  Location: [facility];  Service: General;  Laterality: N/A;   [place] EXTRACTION     Patient Active Problem List   Diagnosis Date Noted   Indication for care in labor or delivery 01/07/2018   NSVD (normal spontaneous vaginal delivery) 01/07/2018   Rectal carcinoid tumor 05/23/2014   Melanoma of skin, site unspecified 01/22/2013   IBD (inflammatory  bowel disease) 02/26/2011     THERAPY DIAG:  Cervicalgia  Abnormal posture   PCP: Lenard Simmer MD  REFERRING PROVIDER: Lorine Bears, NP  REFERRING DIAG: M54.2 (ICD-10-CM) - Cervicalgia M54.12 (ICD-10-CM) - Cervical radiculopathy  EVAL THERAPY DIAG:  Cervicalgia  Abnormal posture  Rationale for Evaluation and Treatment: Rehabilitation  ONSET DATE: Approx. 04/2021  SUBJECTIVE:  SUBJECTIVE STATEMENT: She reported no headaches from weekend.  Felt a little better overall since last visit.  Indicated stiffness/tightness in both sides but Rt > Lt.    PERTINENT HISTORY:  GERD, Melanoma, Crohn's Disease.  History of injections to area.   PAIN:  NPRS scale: at worst 7/10 Pain location: bilateral upper trap Pain description: sore, distracting, headaches Aggravating factors: work positioning, lifting, turning head Relieving factors: stretching, rest from work, tylenol, steriod pack  PRECAUTIONS: None  WEIGHT BEARING RESTRICTIONS: No  FALLS:  Has patient fallen in last 6 months? No  LIVING ENVIRONMENT: Lives with: lives with their family   OCCUPATION: Work requires sitting  PLOF: Independent, has 43 year old, reading, Rt handed   PATIENT GOALS: Reduce pain, improve sleep/positioning at work.    OBJECTIVE:   PATIENT SURVEYS:  05/17/2022:  FOTO update:  69  05/03/2022 FOTO intake: 61   predicted:  68  POSTURE: 05/03/2022 Mild forward head posture, rounded shoulders  PALPATION: 05/03/2022 Numerous trigger points bilateral upper trap , levator scap  PAIVM limitation in mid thoracic throughout for cPA   CERVICAL ROM:   ROM AROM (deg) 05/03/2022 AROM 05/13/2022 AROM 05/24/2022 AROM 06/30/2022  Flexion 65 c pain noted 62 65 62 c pain  Extension 89 85  70   Right lateral flexion      Left lateral flexion      Right rotation 85   85  Left rotation 85   90   (Blank rows = not tested)  UPPER EXTREMITY ROM:  ROM Right 05/03/2022 Left 05/03/2022  Shoulder flexion    Shoulder extension    Shoulder abduction    Shoulder adduction    Shoulder extension    Shoulder internal rotation    Shoulder external rotation    Elbow flexion    Elbow extension    Wrist flexion    Wrist extension    Wrist ulnar deviation    Wrist radial deviation    Wrist pronation    Wrist supination     (Blank rows = not tested)  UPPER EXTREMITY MMT:  MMT Right 05/03/2022 Left 05/03/2022 Right 06/30/2022 Left 06/30/2022  Shoulder flexion 5/5 5/5 5/5 5/5  Shoulder extension      Shoulder abduction 5/5 5/5 5/5 5/5  Shoulder adduction      Shoulder extension      Shoulder internal rotation 5/5 5/5 5/5 5/5  Shoulder external rotation 4+/5 4+/5 5/5 5/5  Middle trapezius      Lower trapezius      Elbow flexion      Elbow extension      Wrist flexion      Wrist extension      Wrist ulnar deviation      Wrist radial deviation      Wrist pronation      Wrist supination      Grip strength       (Blank rows = not tested)  CERVICAL SPECIAL TESTS:  05/03/2022 No specific testing today   FUNCTIONAL TESTS:  05/03/2022 No specific testing today   TODAY'S TREATMENT: DATE:  07/05/2022 Manual:  Compression to bilateral upper trap  Trigger Point Dry-Needling: Treatment instructions: Expect mild to moderate muscle soreness. S/S of pneumothorax if dry needled over a lung field, and to seek immediate medical attention should they occur. Patient verbalized understanding of these instructions and education.  Patient Consent Given: Yes Education handout provided: Yes Muscles treated: Rt upper trap, Lt upper trap, Rt mid cervical paraspinals  Treatment response/outcome: Concordant symptoms, fair tolerance, no adverse reactions observed other than typical soreness.    Estim Pre mod to tolerance 10 mins to bilateral upper trap c self stretching throughout.  No adverse reactions during.    DATE:  06/30/2022 Therex: UBE fwd/back 3 mins each way lvl 3 Seated upper trap stretch 15 sec off and on during 5 mins of heat, performed bilaterally  Manual:  Compression to bilateral upper trap, g4 mobs mid thoracic cPA, regional PA  Trigger Point Dry-Needling: Treatment instructions: Expect mild to moderate muscle soreness. S/S of pneumothorax if dry needled over a lung field, and to seek immediate medical attention should they occur. Patient verbalized understanding of these instructions and education.  Patient Consent Given: Yes Education handout provided: Yes Muscles treated: Rt upper trap  Treatment response/outcome: Concordant symptoms, fair tolerance, no adverse reactions observed other than typical soreness.   Moist heat 5 mins , added with stretching   DATE:  05/24/2022 Therex: Verbal review of existing HEP for knowledge and consistency encouragement.  UBE fwd/back 4 mins each way lvl 3 Seated upper trap stretch 15 sec x 2 bilateral Seated scapular retraction c bilateral UE ER blue band x 10   Manual:  Compression to Lt upper trap.   Trigger Point Dry-Needling: Treatment instructions: Expect mild to moderate muscle soreness. S/S of pneumothorax if dry needled over a lung field, and to seek immediate medical attention should they occur. Patient verbalized understanding of these instructions and education.  Patient Consent Given: Yes Education handout provided: Yes Muscles treated: bilateral upper trap  Treatment response/outcome: Concordant symptoms, fair tolerance, no adverse reactions observed other than typical soreness.    DATE:  05/17/2022 Therex: UBE fwd/back 4 mins each way lvl 3 Seated upper trap stretch 15 sec off and on during 5 mins of heat, performed bilaterally  Manual: Compression to bilateral upper trap.  cPA G4 T4-T9,  regional PA G4 mobs to mid thoracic for mobility gains.   Trigger Point Dry-Needling  Treatment instructions: Expect mild to moderate muscle soreness. S/S of pneumothorax if dry needled over a lung field, and to seek immediate medical attention should they occur. Patient verbalized understanding of these instructions and education.  Patient Consent Given: Yes Education handout provided: Yes Muscles treated: Rt upper trap  Treatment response/outcome: Concordant symptoms, fair tolerance, no adverse reactions observed other than typical soreness.   Moist heat to Rt upper trap during stretching  DATE:  05/13/2022 Therex: UBE fwd/back 3 mins each way lvl 2.5  Seated blue band bilateral UE ER c scapular retraction blue band x 10 Standing rows blue band x 15 Standing gh ext blue band x 15 Warrior pose at wall c thoracici rotation c horizontal abduction blue band x 10   Manual: Compression to bilateral upper trap.  cPA G4 T4-T9, regional PA G4 mobs to mid thoracic for mobility gains.    PATIENT EDUCATION:  05/13/2022 Education details: HEP update Person educated: Patient Education method: Consulting civil engineer, Demonstration, Verbal cues, and Handouts Education comprehension: verbalized understanding, returned demonstration, and verbal cues required  HOME EXERCISE PROGRAM: Access Code: [email or web address] URL: [email or web address] Date: 05/13/2022 Prepared by: Scot Jun  Exercises - Seated Upper Trapezius Stretch  - 3-5 x daily - 7 x weekly - 1 sets - 3 reps - 15 hold - Gentle Levator Scapulae Stretch  - 3-5 x daily - 7 x weekly - 1 sets - 3 reps - 15 hold - Shoulder External Rotation and Scapular Retraction with Resistance  - 1-2  x daily - 7 x weekly - 1-2 sets - 10-15 reps - Seated Cervical Retraction  - 1 x daily - 7 x weekly - 3 sets - 10 reps - Standing Shoulder Row with Anchored Resistance  - 1-2 x daily - 7 x weekly - 1-2 sets - 10-15 reps - Shoulder Extension with Resistance   - 1-2 x daily - 7 x weekly - 1-2 sets - 10-15 reps - Standing Shoulder Horizontal Abduction with Resistance  - 1-2 x daily - 7 x weekly - 1-2 sets - 10-15 reps  ASSESSMENT:  CLINICAL IMPRESSION Trigger points continued most noted in Rt upper trap then Lt upper trap.  Cervical paraspinals trigger point release techniques didn't seem as specific to concordant pain.  Use of estim today to help reduce post needing soreness and continue efforts to reduce tone in upper trap.   OBJECTIVE IMPAIRMENTS: decreased endurance, decreased mobility, decreased ROM, decreased strength, increased fascial restrictions, impaired perceived functional ability, increased muscle spasms, impaired flexibility, impaired UE functional use, improper body mechanics, postural dysfunction, and pain.   ACTIVITY LIMITATIONS: carrying, lifting, bending, sitting, and sleeping  PARTICIPATION LIMITATIONS: interpersonal relationship, community activity, and occupation  PERSONAL FACTORS: No specific factors are also affecting patient's functional outcome.   REHAB POTENTIAL: Good  CLINICAL DECISION MAKING: Stable/uncomplicated  EVALUATION COMPLEXITY: Low   GOALS: Goals reviewed with patient? Yes  SHORT TERM GOALS: (target date for Short term goals are 3 weeks 05/24/2022)  1.Patient will demonstrate independent use of home exercise program to maintain progress from in clinic treatments. Goal status: Met 05/13/2022  LONG TERM GOALS: (target dates for all long term goals are 10 weeks  07/12/2022 )   1. Patient will demonstrate/report pain at worst less than or equal to 2/10 to facilitate minimal limitation in daily activity secondary to pain symptoms. Goal status: on going 06/30/2022   2. Patient will demonstrate independent use of home exercise program to facilitate ability to maintain/progress functional gains from skilled physical therapy services. Goal status: on going 06/30/2022   3. Patient will demonstrate FOTO outcome >  or = 68 % to indicate reduced disability due to condition. Goal status: on going 06/30/2022   4.  Patient will demonstrate cervical AROM WFL s symptoms to facilitate usual head movements for daily activity including driving, self care.   Goal status: on going 06/30/2022   5.  Patient will demonstrate bilateral shoulder MMT 5/5 throughout to facilitate lifting, carrying at PLOF.   Goal status: on going 06/30/2022   6.  Patient will demonstrate/report ability to sleep s restriction due to symptoms.  Goal status: on going 06/30/2022    PLAN:  PT FREQUENCY: 1-2x/week  PT DURATION: 10 weeks  PLANNED INTERVENTIONS: Therapeutic exercises, Therapeutic activity, Neuro Muscular re-education, Balance training, Gait training, Patient/Family education, Joint mobilization, Stair training, DME instructions, Dry Needling, Electrical stimulation, Cryotherapy, vasopneumatic device,Traction, Moist heat, Taping, Ultrasound, Ionotophoresis '4mg'$ /ml Dexamethasone, and Manual therapy.  All included unless contraindicated  PLAN FOR NEXT SESSION:  Recert on [id number] required.  Dry needling, estim as desired.  Progressive scapular strengthening .    Scot Jun, PT, DPT, OCS, ATC 07/05/22  12:13 PM

## 2022-07-08 DIAGNOSIS — Z1151 Encounter for screening for human papillomavirus (HPV): Secondary | ICD-10-CM | POA: Diagnosis not present

## 2022-07-08 DIAGNOSIS — K5090 Crohn's disease, unspecified, without complications: Secondary | ICD-10-CM | POA: Diagnosis not present

## 2022-07-08 DIAGNOSIS — Z01419 Encounter for gynecological examination (general) (routine) without abnormal findings: Secondary | ICD-10-CM | POA: Diagnosis not present

## 2022-07-08 DIAGNOSIS — E559 Vitamin D deficiency, unspecified: Secondary | ICD-10-CM | POA: Diagnosis not present

## 2022-07-08 DIAGNOSIS — Z124 Encounter for screening for malignant neoplasm of cervix: Secondary | ICD-10-CM | POA: Diagnosis not present

## 2022-07-08 DIAGNOSIS — Z6826 Body mass index (BMI) 26.0-26.9, adult: Secondary | ICD-10-CM | POA: Diagnosis not present

## 2022-07-12 ENCOUNTER — Other Ambulatory Visit: Payer: Self-pay | Admitting: Obstetrics and Gynecology

## 2022-07-12 DIAGNOSIS — Z803 Family history of malignant neoplasm of breast: Secondary | ICD-10-CM

## 2022-07-14 ENCOUNTER — Ambulatory Visit: Payer: BC Managed Care – PPO | Admitting: Rehabilitative and Restorative Service Providers"

## 2022-07-14 ENCOUNTER — Encounter: Payer: Self-pay | Admitting: Rehabilitative and Restorative Service Providers"

## 2022-07-14 DIAGNOSIS — M542 Cervicalgia: Secondary | ICD-10-CM

## 2022-07-14 DIAGNOSIS — R293 Abnormal posture: Secondary | ICD-10-CM | POA: Diagnosis not present

## 2022-07-14 NOTE — Therapy (Addendum)
OUTPATIENT PHYSICAL THERAPY TREATMENT NOTE /RECERT/ PROGRESS NOTE   Patient Name: Kelli Lewis MRN: [id number] DOB:1980/05/23, 42 y.o., female Today's Date: 07/14/2022  Progress Note Reporting Period 05/03/2022 to 07/14/2022  See note below for Objective Data and Assessment of Progress/Goals.   END OF SESSION:   PT End of Session - 07/14/22 1103     Visit Number 8    Number of Visits 20    Date for PT Re-Evaluation 09/08/22    Authorization Type [employer] $50 copay    Authorization - Number of Visits 30    PT Start Time 1100    PT Stop Time 1130    PT Time Calculation (min) 30 min    Activity Tolerance Patient tolerated treatment well    Behavior During Therapy WFL for tasks assessed/performed                  Past Medical History:  Diagnosis Date   Allergy    Anal fissure 01/04/2011   Anxiety    no per pt   Asthma    seasonal allergy induced does not use inhalers   Colon polyps    Colon ulcer    Crohn's disease ([place])    Diverticulosis    Duodenal diverticulum    Family history of malignant neoplasm of gastrointestinal tract    GERD (gastroesophageal reflux disease)    hx of with pregnancy   Headache    occas migraines   Heart murmur    Herpes    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[place])    right gluteal 2002-2003, melanoma   Rectal carcinoid tumor    Vitamin B12 deficiency    Vitamin D deficiency    Past Surgical History:  Procedure Laterality Date   COLONOSCOPY     EUS N/A 05/02/2014   Procedure: LOWER ENDOSCOPIC ULTRASOUND (EUS);  Surgeon: Milus Banister, MD;  Location: [facility];  Service: Endoscopy;  Laterality: N/A;   MELANOMA EXCISION     PARTIAL PROCTECTOMY BY TEM N/A 05/23/2014   Procedure: TEM PARTIAL PROCTECTOMY OF RECTAL MASS;  Surgeon:  Boston, MD;  Location: [facility];  Service: General;  Laterality: N/A;   WISDOM TOOTH EXTRACTION     Patient Active Problem List   Diagnosis Date Noted   Indication for care in labor or delivery 01/07/2018   NSVD  (normal spontaneous vaginal delivery) 01/07/2018   Rectal carcinoid tumor 05/23/2014   Melanoma of skin, site unspecified 01/22/2013   IBD (inflammatory bowel disease) 02/26/2011     THERAPY DIAG:  Cervicalgia - Plan: PT plan of care cert/re-cert  Abnormal posture - Plan: PT plan of care cert/re-cert   PCP: Lenard Simmer MD  REFERRING PROVIDER: Lorine Bears, NP  REFERRING DIAG: M54.2 (ICD-10-CM) - Cervicalgia M54.12 (ICD-10-CM) - Cervical radiculopathy  EVAL THERAPY DIAG:  Cervicalgia  Abnormal posture  Rationale for Evaluation and Treatment: Rehabilitation  ONSET DATE: Approx. 04/2021  SUBJECTIVE:  SUBJECTIVE STATEMENT: She indicated "feeling good" overall since last weekend.  Reported reduction from 8 to 5/10 at worst.  She felt like she got more movement from weekend.    PERTINENT HISTORY:  GERD, Melanoma, Crohn's Disease.  History of injections to area.   PAIN:  NPRS scale: at worst 7/10 Pain location: bilateral upper trap Pain description: sore, distracting, headaches Aggravating factors: work positioning, lifting, turning head Relieving factors: stretching, rest from work, tylenol, steriod pack  PRECAUTIONS: None  WEIGHT BEARING RESTRICTIONS: No  FALLS:  Has patient fallen in last 6 months? No  LIVING ENVIRONMENT: Lives with: lives with their family   OCCUPATION: Work requires sitting  PLOF: Independent, has 43 year old, reading, Rt handed   PATIENT GOALS: Reduce pain, improve sleep/positioning at work.    OBJECTIVE:   PATIENT SURVEYS:  07/14/2022:  FOTO:  59(tested today after recent exacerbation of symptoms)   05/17/2022:  FOTO update:  69  05/03/2022 FOTO intake: 61   predicted:  68  POSTURE: 05/03/2022 Mild forward head posture,  rounded shoulders  PALPATION: 07/14/2022:  Trigger points c concordant symptoms bilateral upper trap Rt > Lt.   05/03/2022 Numerous trigger points bilateral upper trap , levator scap  PAIVM limitation in mid thoracic throughout for cPA   CERVICAL ROM:   ROM AROM (deg) 05/03/2022 AROM 05/13/2022 AROM 05/24/2022 AROM 06/30/2022 AROM 07/14/2022  Flexion 65 c pain noted 62 65 62 c pain 65  Extension 89 85  70 80  Right lateral flexion       Left lateral flexion       Right rotation 85   85 86  Left rotation 85   90 86   (Blank rows = not tested)  UPPER EXTREMITY ROM:  ROM Right 05/03/2022 Left 05/03/2022  Shoulder flexion    Shoulder extension    Shoulder abduction    Shoulder adduction    Shoulder extension    Shoulder internal rotation    Shoulder external rotation    Elbow flexion    Elbow extension    Wrist flexion    Wrist extension    Wrist ulnar deviation    Wrist radial deviation    Wrist pronation    Wrist supination     (Blank rows = not tested)  UPPER EXTREMITY MMT:  MMT Right 05/03/2022 Left 05/03/2022 Right 06/30/2022 Left 06/30/2022  Shoulder flexion 5/5 5/5 5/5 5/5  Shoulder extension      Shoulder abduction 5/5 5/5 5/5 5/5  Shoulder adduction      Shoulder extension      Shoulder internal rotation 5/5 5/5 5/5 5/5  Shoulder external rotation 4+/5 4+/5 5/5 5/5  Middle trapezius      Lower trapezius      Elbow flexion      Elbow extension      Wrist flexion      Wrist extension      Wrist ulnar deviation      Wrist radial deviation      Wrist pronation      Wrist supination      Grip strength       (Blank rows = not tested)  CERVICAL SPECIAL TESTS:  05/03/2022 No specific testing today   FUNCTIONAL TESTS:  05/03/2022 No specific testing today   TODAY'S TREATMENT: DATE:  07/14/2022 Manual:  Compression to bilateral upper trap  Trigger Point Dry-Needling: Treatment instructions: Expect mild to moderate muscle soreness. S/S of pneumothorax if dry  needled over a lung field,  and to seek immediate medical attention should they occur. Patient verbalized understanding of these instructions and education.  Patient Consent Given: Yes Education handout provided: Yes Muscles treated: Rt upper trap, Lt upper trap  Treatment response/outcome: Concordant symptoms, fair tolerance, no adverse reactions observed other than typical soreness.   Estim Pre mod to tolerance 10 mins to bilateral upper trap c self stretching throughout.  No adverse reactions during.  Moist heat Rt upper trap during   DATE:  07/05/2022 Manual:  Compression to bilateral upper trap  Trigger Point Dry-Needling: Treatment instructions: Expect mild to moderate muscle soreness. S/S of pneumothorax if dry needled over a lung field, and to seek immediate medical attention should they occur. Patient verbalized understanding of these instructions and education.  Patient Consent Given: Yes Education handout provided: Yes Muscles treated: Rt upper trap, Lt upper trap, Rt mid cervical paraspinals   Treatment response/outcome: Concordant symptoms, fair tolerance, no adverse reactions observed other than typical soreness.   Estim Pre mod to tolerance 10 mins to bilateral upper trap c self stretching throughout.  No adverse reactions during.    DATE:  06/30/2022 Therex: Cervical range of motion x 10 each way (flexion, extension, rotation) Upper trap stretch 15 sec x 3 bilateral.  Manual:  Compression to bilateral upper trap, g4 mobs mid thoracic cPA, regional PA  Trigger Point Dry-Needling: Treatment instructions: Expect mild to moderate muscle soreness. S/S of pneumothorax if dry needled over a lung field, and to seek immediate medical attention should they occur. Patient verbalized understanding of these instructions and education.  Patient Consent Given: Yes Education handout provided: Yes Muscles treated: Rt upper trap  Treatment response/outcome: Concordant symptoms,  fair tolerance, no adverse reactions observed other than typical soreness.   Moist heat 5 mins , added with stretching   DATE:  05/24/2022 Therex: Verbal review of existing HEP for knowledge and consistency encouragement.  UBE fwd/back 4 mins each way lvl 3 Seated upper trap stretch 15 sec x 2 bilateral Seated scapular retraction c bilateral UE ER blue band x 10   Manual:  Compression to Lt upper trap.   Trigger Point Dry-Needling: Treatment instructions: Expect mild to moderate muscle soreness. S/S of pneumothorax if dry needled over a lung field, and to seek immediate medical attention should they occur. Patient verbalized understanding of these instructions and education.  Patient Consent Given: Yes Education handout provided: Yes Muscles treated: bilateral upper trap  Treatment response/outcome: Concordant symptoms, fair tolerance, no adverse reactions observed other than typical soreness.    PATIENT EDUCATION:  05/13/2022 Education details: HEP update Person educated: Patient Education method: Consulting civil engineer, Demonstration, Verbal cues, and Handouts Education comprehension: verbalized understanding, returned demonstration, and verbal cues required  HOME EXERCISE PROGRAM: Access Code: [email or web address] URL: [email or web address] Date: 05/13/2022 Prepared by: Scot Jun  Exercises - Seated Upper Trapezius Stretch  - 3-5 x daily - 7 x weekly - 1 sets - 3 reps - 15 hold - Gentle Levator Scapulae Stretch  - 3-5 x daily - 7 x weekly - 1 sets - 3 reps - 15 hold - Shoulder External Rotation and Scapular Retraction with Resistance  - 1-2 x daily - 7 x weekly - 1-2 sets - 10-15 reps - Seated Cervical Retraction  - 1 x daily - 7 x weekly - 3 sets - 10 reps - Standing Shoulder Row with Anchored Resistance  - 1-2 x daily - 7 x weekly - 1-2 sets - 10-15 reps - Shoulder Extension with Resistance  - 1-2 x  daily - 7 x weekly - 1-2 sets - 10-15 reps - Standing Shoulder Horizontal  Abduction with Resistance  - 1-2 x daily - 7 x weekly - 1-2 sets - 10-15 reps  ASSESSMENT:  CLINICAL IMPRESSION Pt has attended 8 visits overall during course of treatment. She had reported good improvement overall but recently had exacerbation of symptoms that led to return to clinic.  See objective data for updated information.  At this time, cervical pain c headaches c mobility deficits noted primarily with prolonged static positioning (needed for desk work).  Continued skilled PT services warranted at this time.   .   OBJECTIVE IMPAIRMENTS: decreased endurance, decreased mobility, decreased ROM, decreased strength, increased fascial restrictions, impaired perceived functional ability, increased muscle spasms, impaired flexibility, impaired UE functional use, improper body mechanics, postural dysfunction, and pain.   ACTIVITY LIMITATIONS: carrying, lifting, bending, sitting, and sleeping  PARTICIPATION LIMITATIONS: interpersonal relationship, community activity, and occupation  PERSONAL FACTORS: No specific factors are also affecting patient's functional outcome.   REHAB POTENTIAL: Good  CLINICAL DECISION MAKING: Stable/uncomplicated  EVALUATION COMPLEXITY: Low   GOALS: Goals reviewed with patient? Yes  SHORT TERM GOALS: (target date for Short term goals are 3 weeks 05/24/2022)  1.Patient will demonstrate independent use of home exercise program to maintain progress from in clinic treatments. Goal status: Met 05/13/2022  LONG TERM GOALS: (target dates for all long term goals are 8 weeks  09/08/2022 )   1. Patient will demonstrate/report pain at worst less than or equal to 2/10 to facilitate minimal limitation in daily activity secondary to pain symptoms. Goal status: on going 07/14/2022   2. Patient will demonstrate independent use of home exercise program to facilitate ability to maintain/progress functional gains from skilled physical therapy services. Goal status: on going  07/14/2022   3. Patient will demonstrate FOTO outcome > or = 68 % to indicate reduced disability due to condition. Goal status: on going 07/14/2022   4.  Patient will demonstrate cervical AROM WFL s symptoms to facilitate usual head movements for daily activity including driving, self care.   Goal status: on going 07/14/2022   5.  Patient will demonstrate bilateral shoulder MMT 5/5 throughout to facilitate lifting, carrying at PLOF.   Goal status: on going 07/14/2022   6.  Patient will demonstrate/report ability to sleep s restriction due to symptoms.  Goal status: on going 07/14/2022    PLAN:  PT FREQUENCY: 1-2x/week  PT DURATION: 8 weeks  PLANNED INTERVENTIONS: Therapeutic exercises, Therapeutic activity, Neuro Muscular re-education, Balance training, Gait training, Patient/Family education, Joint mobilization, Stair training, DME instructions, Dry Needling, Electrical stimulation, Cryotherapy, vasopneumatic device,Traction, Moist heat, Taping, Ultrasound, Ionotophoresis 4mg /ml Dexamethasone, and Manual therapy.  All included unless contraindicated  PLAN FOR NEXT SESSION:   Dry needling, estim as desired.  Progressive scapular strengthening .    Scot Jun, PT, DPT, OCS, ATC 07/14/22  11:35 AM

## 2022-07-19 ENCOUNTER — Encounter: Payer: BC Managed Care – PPO | Admitting: Rehabilitative and Restorative Service Providers"

## 2022-07-28 ENCOUNTER — Ambulatory Visit: Payer: BC Managed Care – PPO | Admitting: Rehabilitative and Restorative Service Providers"

## 2022-07-28 ENCOUNTER — Encounter: Payer: Self-pay | Admitting: Rehabilitative and Restorative Service Providers"

## 2022-07-28 DIAGNOSIS — R293 Abnormal posture: Secondary | ICD-10-CM

## 2022-07-28 DIAGNOSIS — M542 Cervicalgia: Secondary | ICD-10-CM

## 2022-07-28 NOTE — Therapy (Signed)
OUTPATIENT PHYSICAL THERAPY TREATMENT NOTE   Patient Name: Kelli Lewis MRN: [id number] DOB:1980-12-27, 42 y.o., female Today's Date: 07/28/2022   END OF SESSION:   PT End of Session - 07/28/22 1350     Visit Number 9    Number of Visits 20    Date for PT Re-Evaluation 09/08/22    Authorization Type [employer] $50 copay    Authorization - Number of Visits 30    PT Start Time [id number]    PT Stop Time 1425    PT Time Calculation (min) 37 min    Activity Tolerance Patient tolerated treatment well    Behavior During Therapy WFL for tasks assessed/performed                   Past Medical History:  Diagnosis Date   Allergy    Anal fissure 01/04/2011   Anxiety    no per pt   Asthma    seasonal allergy induced does not use inhalers   Colon polyps    Colon ulcer    Crohn's disease    Diverticulosis    Duodenal diverticulum    Family history of malignant neoplasm of gastrointestinal tract    GERD (gastroesophageal reflux disease)    hx of with pregnancy   Headache    occas migraines   Heart murmur    Herpes    Melanoma of buttock    right gluteal 2002-2003, melanoma   Rectal carcinoid tumor    Vitamin B12 deficiency    Vitamin D deficiency    Past Surgical History:  Procedure Laterality Date   COLONOSCOPY     EUS N/A 05/02/2014   Procedure: LOWER ENDOSCOPIC ULTRASOUND (EUS);  Surgeon: Milus Banister, MD;  Location: [facility];  Service: Endoscopy;  Laterality: N/A;   MELANOMA EXCISION     PARTIAL PROCTECTOMY BY TEM N/A 05/23/2014   Procedure: TEM PARTIAL PROCTECTOMY OF RECTAL MASS;  Surgeon:  Boston, MD;  Location: [facility];  Service: General;  Laterality: N/A;   [place] EXTRACTION     Patient Active Problem List   Diagnosis Date Noted   Indication for care in labor or delivery 01/07/2018   NSVD (normal spontaneous vaginal delivery) 01/07/2018   Rectal carcinoid tumor 05/23/2014   Melanoma of skin, site unspecified 01/22/2013   IBD (inflammatory bowel  disease) 02/26/2011     THERAPY DIAG:  Cervicalgia  Abnormal posture   PCP: Lenard Simmer MD  REFERRING PROVIDER: Lorine Bears, NP  REFERRING DIAG: M54.2 (ICD-10-CM) - Cervicalgia M54.12 (ICD-10-CM) - Cervical radiculopathy  EVAL THERAPY DIAG:  Cervicalgia  Abnormal posture  Rationale for Evaluation and Treatment: Rehabilitation  ONSET DATE: Approx. 04/2021  SUBJECTIVE:  SUBJECTIVE STATEMENT: She indicated feeling shooting pain headaches on Rt side in last 3 days.  She had to skip last week.  She did report feeling improvement prior to recent.   She reported stress from sick kid.   She indicated good during the day and noted at night time.    PERTINENT HISTORY:  GERD, Melanoma, Crohn's Disease.  History of injections to area.   PAIN:  NPRS scale: at worst 7/10 Pain location: bilateral upper trap Pain description: sore, distracting, headaches Aggravating factors: work positioning, lifting, turning head Relieving factors: stretching, rest from work, tylenol, steriod pack  PRECAUTIONS: None  WEIGHT BEARING RESTRICTIONS: No  FALLS:  Has patient fallen in last 6 months? No  LIVING ENVIRONMENT: Lives with: lives with their family   OCCUPATION: Work requires sitting  PLOF: Independent, has 43 year old, reading, Rt handed   PATIENT GOALS: Reduce pain, improve sleep/positioning at work.    OBJECTIVE:   PATIENT SURVEYS:  07/14/2022:  FOTO:  59(tested today after recent exacerbation of symptoms)   05/17/2022:  FOTO update:  69  05/03/2022 FOTO intake: 61   predicted:  68  POSTURE: 05/03/2022 Mild forward head posture, rounded shoulders  PALPATION: 07/28/2022:  noted trigger point Rt upper trap with local and headache symptoms noted.   07/14/2022:  Trigger  points c concordant symptoms bilateral upper trap Rt > Lt.   05/03/2022 Numerous trigger points bilateral upper trap , levator scap  PAIVM limitation in mid thoracic throughout for cPA   CERVICAL ROM:   ROM AROM (deg) 05/03/2022 AROM 05/13/2022 AROM 05/24/2022 AROM 06/30/2022 AROM 07/14/2022  Flexion 65 c pain noted 62 65 62 c pain 65  Extension 89 85  70 80  Right lateral flexion       Left lateral flexion       Right rotation 85   85 86  Left rotation 85   90 86   (Blank rows = not tested)  UPPER EXTREMITY ROM:  ROM Right 05/03/2022 Left 05/03/2022  Shoulder flexion    Shoulder extension    Shoulder abduction    Shoulder adduction    Shoulder extension    Shoulder internal rotation    Shoulder external rotation    Elbow flexion    Elbow extension    Wrist flexion    Wrist extension    Wrist ulnar deviation    Wrist radial deviation    Wrist pronation    Wrist supination     (Blank rows = not tested)  UPPER EXTREMITY MMT:  MMT Right 05/03/2022 Left 05/03/2022 Right 06/30/2022 Left 06/30/2022  Shoulder flexion 5/5 5/5 5/5 5/5  Shoulder extension      Shoulder abduction 5/5 5/5 5/5 5/5  Shoulder adduction      Shoulder extension      Shoulder internal rotation 5/5 5/5 5/5 5/5  Shoulder external rotation 4+/5 4+/5 5/5 5/5  Middle trapezius      Lower trapezius      Elbow flexion      Elbow extension      Wrist flexion      Wrist extension      Wrist ulnar deviation      Wrist radial deviation      Wrist pronation      Wrist supination      Grip strength       (Blank rows = not tested)  CERVICAL SPECIAL TESTS:  05/03/2022 No specific testing today   FUNCTIONAL TESTS:  05/03/2022 No specific  testing today   TODAY'S TREATMENT: DATE:  07/28/2022 Manual:  Compression to bilateral upper trap  Trigger Point Dry-Needling: Treatment instructions: Expect mild to moderate muscle soreness. S/S of pneumothorax if dry needled over a lung field, and to seek immediate medical  attention should they occur. Patient verbalized understanding of these instructions and education.  Patient Consent Given: Yes Education handout provided: Yes Muscles treated: Rt upper trap  Treatment response/outcome: Concordant symptoms, fair tolerance, no adverse reactions observed other than typical soreness.    Therex: Education on timing of HEP and cues for management of nighttime symptoms c use at nighttime more. Handout with new exercises listed below Prone scapular retraction c GH ext bilateral 5 sec hold x 10 Prone scapular retraction c horizontal abduction bilateral 5 sec hold x 10 Prone y hold bilateral 5 sec x 10   Estim Pre mod to tolerance 10 mins to bilateral upper trap c self stretching throughout.  No adverse reactions during.  Moist heat Rt upper trap during   DATE:  07/14/2022 Manual:  Compression to bilateral upper trap  Trigger Point Dry-Needling: Treatment instructions: Expect mild to moderate muscle soreness. S/S of pneumothorax if dry needled over a lung field, and to seek immediate medical attention should they occur. Patient verbalized understanding of these instructions and education.  Patient Consent Given: Yes Education handout provided: Yes Muscles treated: Rt upper trap, Lt upper trap  Treatment response/outcome: Concordant symptoms, fair tolerance, no adverse reactions observed other than typical soreness.   Estim Pre mod to tolerance 10 mins to bilateral upper trap c self stretching throughout.  No adverse reactions during.  Moist heat Rt upper trap during   DATE:  07/05/2022 Manual:  Compression to bilateral upper trap  Trigger Point Dry-Needling: Treatment instructions: Expect mild to moderate muscle soreness. S/S of pneumothorax if dry needled over a lung field, and to seek immediate medical attention should they occur. Patient verbalized understanding of these instructions and education.  Patient Consent Given: Yes Education handout  provided: Yes Muscles treated: Rt upper trap, Lt upper trap, Rt mid cervical paraspinals   Treatment response/outcome: Concordant symptoms, fair tolerance, no adverse reactions observed other than typical soreness.   Estim Pre mod to tolerance 10 mins to bilateral upper trap c self stretching throughout.  No adverse reactions during.    DATE:  06/30/2022 Therex: Cervical range of motion x 10 each way (flexion, extension, rotation) Upper trap stretch 15 sec x 3 bilateral.  Manual:  Compression to bilateral upper trap, g4 mobs mid thoracic cPA, regional PA  Trigger Point Dry-Needling: Treatment instructions: Expect mild to moderate muscle soreness. S/S of pneumothorax if dry needled over a lung field, and to seek immediate medical attention should they occur. Patient verbalized understanding of these instructions and education.  Patient Consent Given: Yes Education handout provided: Yes Muscles treated: Rt upper trap  Treatment response/outcome: Concordant symptoms, fair tolerance, no adverse reactions observed other than typical soreness.   Moist heat 5 mins , added with stretching   PATIENT EDUCATION:  07/28/2022 Education details: HEP update Person educated: Patient Education method: Consulting civil engineer, Demonstration, Verbal cues, and Handouts Education comprehension: verbalized understanding, returned demonstration, and verbal cues required  HOME EXERCISE PROGRAM: Access Code: [email or web address] URL: [email or web address] Date: 07/28/2022 Prepared by: Scot Jun  Exercises - Seated Upper Trapezius Stretch  - 3-5 x daily - 7 x weekly - 1 sets - 3 reps - 15 hold - Gentle Levator Scapulae Stretch  - 3-5 x daily - 7 x weekly -  1 sets - 3 reps - 15 hold - Shoulder External Rotation and Scapular Retraction with Resistance  - 1-2 x daily - 7 x weekly - 1-2 sets - 10-15 reps - Seated Cervical Retraction  - 1 x daily - 7 x weekly - 3 sets - 10 reps - Standing Shoulder Row with  Anchored Resistance  - 1-2 x daily - 7 x weekly - 1-2 sets - 10-15 reps - Shoulder Extension with Resistance  - 1-2 x daily - 7 x weekly - 1-2 sets - 10-15 reps - Standing Shoulder Horizontal Abduction with Resistance  - 1-2 x daily - 7 x weekly - 1-2 sets - 10-15 reps - Prone Scapular Slide with Shoulder Extension  - 1 x daily - 7 x weekly - 1 sets - 10 reps - 5 hold - Prone Scapular Retraction Arms at Side  - 1 x daily - 7 x weekly - 1 sets - 10 reps - 5 hold - Prone Scapular Retraction Y  - 1 x daily - 7 x weekly - 1 sets - 10 reps - 5 hold  ASSESSMENT:  CLINICAL IMPRESSION Positive signs in short term from treatment. Indications of repetitive lifting of kid, stress and normal work activity as triggers for increased muscle tightness and symptoms resulting.  Added progressive scapular strengthening for home to help improve.  Continued skilled PT services indicated due to symptoms.   OBJECTIVE IMPAIRMENTS: decreased endurance, decreased mobility, decreased ROM, decreased strength, increased fascial restrictions, impaired perceived functional ability, increased muscle spasms, impaired flexibility, impaired UE functional use, improper body mechanics, postural dysfunction, and pain.   ACTIVITY LIMITATIONS: carrying, lifting, bending, sitting, and sleeping  PARTICIPATION LIMITATIONS: interpersonal relationship, community activity, and occupation  PERSONAL FACTORS: No specific factors are also affecting patient's functional outcome.   REHAB POTENTIAL: Good  CLINICAL DECISION MAKING: Stable/uncomplicated  EVALUATION COMPLEXITY: Low   GOALS: Goals reviewed with patient? Yes  SHORT TERM GOALS: (target date for Short term goals are 3 weeks 05/24/2022)  1.Patient will demonstrate independent use of home exercise program to maintain progress from in clinic treatments. Goal status: Met 05/13/2022  LONG TERM GOALS: (target dates for all long term goals are 8 weeks  09/08/2022 )   1. Patient  will demonstrate/report pain at worst less than or equal to 2/10 to facilitate minimal limitation in daily activity secondary to pain symptoms. Goal status: on going 07/14/2022   2. Patient will demonstrate independent use of home exercise program to facilitate ability to maintain/progress functional gains from skilled physical therapy services. Goal status: on going 07/14/2022   3. Patient will demonstrate FOTO outcome > or = 68 % to indicate reduced disability due to condition. Goal status: on going 07/14/2022   4.  Patient will demonstrate cervical AROM WFL s symptoms to facilitate usual head movements for daily activity including driving, self care.   Goal status: on going 07/14/2022   5.  Patient will demonstrate bilateral shoulder MMT 5/5 throughout to facilitate lifting, carrying at PLOF.   Goal status: on going 07/14/2022   6.  Patient will demonstrate/report ability to sleep s restriction due to symptoms.  Goal status: on going 07/14/2022    PLAN:  PT FREQUENCY: 1-2x/week  PT DURATION: 8 weeks  PLANNED INTERVENTIONS: Therapeutic exercises, Therapeutic activity, Neuro Muscular re-education, Balance training, Gait training, Patient/Family education, Joint mobilization, Stair training, DME instructions, Dry Needling, Electrical stimulation, Cryotherapy, vasopneumatic device,Traction, Moist heat, Taping, Ultrasound, Ionotophoresis 4mg /ml Dexamethasone, and Manual therapy.  All included unless contraindicated  PLAN FOR NEXT SESSION:   Dry needling, estim as desired.  scapular strengthening review.    Scot Jun, PT, DPT, OCS, ATC 07/28/22  2:47 PM

## 2022-08-04 ENCOUNTER — Encounter: Payer: BC Managed Care – PPO | Admitting: Physical Therapy

## 2022-08-11 ENCOUNTER — Ambulatory Visit: Payer: BC Managed Care – PPO | Admitting: Rehabilitative and Restorative Service Providers"

## 2022-08-11 ENCOUNTER — Encounter: Payer: Self-pay | Admitting: Rehabilitative and Restorative Service Providers"

## 2022-08-11 DIAGNOSIS — M542 Cervicalgia: Secondary | ICD-10-CM

## 2022-08-11 DIAGNOSIS — R293 Abnormal posture: Secondary | ICD-10-CM | POA: Diagnosis not present

## 2022-08-11 NOTE — Therapy (Addendum)
OUTPATIENT PHYSICAL THERAPY TREATMENT NOTE /DISCHARGE   Patient Name: Kelli Lewis MRN: [id number] DOB:02-Sep-1980, 42 y.o., female Today's Date: 08/11/2022   END OF SESSION:   PT End of Session - 08/11/22 1146     Visit Number 10    Number of Visits 20    Date for PT Re-Evaluation 09/08/22    Authorization Type [employer] $50 copay    Authorization - Number of Visits 30    PT Start Time 1145    PT Stop Time 1210    PT Time Calculation (min) 25 min    Activity Tolerance Patient tolerated treatment well    Behavior During Therapy [facility] for tasks assessed/performed                    Past Medical History:  Diagnosis Date   Allergy    Anal fissure 01/04/2011   Anxiety    no per pt   Asthma    seasonal allergy induced does not use inhalers   Colon polyps    Colon ulcer    Crohn's disease    Diverticulosis    Duodenal diverticulum    Family history of malignant neoplasm of gastrointestinal tract    GERD (gastroesophageal reflux disease)    hx of with pregnancy   Headache    occas migraines   Heart murmur    Herpes    Melanoma of buttock    right gluteal 2002-2003, melanoma   Rectal carcinoid tumor    Vitamin B12 deficiency    Vitamin D deficiency    Past Surgical History:  Procedure Laterality Date   COLONOSCOPY     EUS N/A 05/02/2014   Procedure: LOWER ENDOSCOPIC ULTRASOUND (EUS);  Surgeon: Rachael Fee, MD;  Location: [facility];  Service: Endoscopy;  Laterality: N/A;   MELANOMA EXCISION     PARTIAL PROCTECTOMY BY TEM N/A 05/23/2014   Procedure: TEM PARTIAL PROCTECTOMY OF RECTAL MASS;  Surgeon: Karie Soda, MD;  Location: [facility];  Service: General;  Laterality: N/A;   WISDOM TOOTH EXTRACTION     Patient Active Problem List   Diagnosis Date Noted   Indication for care in labor or delivery 01/07/2018   NSVD (normal spontaneous vaginal delivery) 01/07/2018   Rectal carcinoid tumor 05/23/2014   Melanoma of skin, site unspecified 01/22/2013   IBD  (inflammatory bowel disease) 02/26/2011     THERAPY DIAG:  Cervicalgia  Abnormal posture   PCP: Alan Mulder MD  REFERRING PROVIDER: Juanda Chance, NP  REFERRING DIAG: M54.2 (ICD-10-CM) - Cervicalgia M54.12 (ICD-10-CM) - Cervical radiculopathy  EVAL THERAPY DIAG:  Cervicalgia  Abnormal posture  Rationale for Evaluation and Treatment: Rehabilitation  ONSET DATE: Approx. 04/2021  SUBJECTIVE:  SUBJECTIVE STATEMENT: She indicated feeling overall better and good progression with new exercises.   She reported general stiffness upon arrival today.   PERTINENT HISTORY:  GERD, Melanoma, Crohn's Disease.  History of injections to area.   PAIN:  NPRS scale: at worst 5/10 Pain location: bilateral upper trap Pain description: sore, distracting, headaches Aggravating factors: work positioning, lifting, turning head Relieving factors: stretching, rest from work, tylenol, steriod pack  PRECAUTIONS: None  WEIGHT BEARING RESTRICTIONS: No  FALLS:  Has patient fallen in last 6 months? No  LIVING ENVIRONMENT: Lives with: lives with their family   OCCUPATION: Work requires sitting  PLOF: Independent, has 43 year old, reading, Rt handed   PATIENT GOALS: Reduce pain, improve sleep/positioning at work.    OBJECTIVE:   PATIENT SURVEYS:  08/11/2022:  FOTO 61  07/14/2022:  FOTO:  59( tested today after recent exacerbation of symptoms)  05/17/2022:  FOTO update:  69  05/03/2022 FOTO intake: 61   predicted:  68  POSTURE: 05/03/2022 Mild forward head posture, rounded shoulders  PALPATION: 07/28/2022:  noted trigger point Rt upper trap with local and headache symptoms noted.   07/14/2022:  Trigger points c concordant symptoms bilateral upper trap Rt > Lt.   05/03/2022 Numerous  trigger points bilateral upper trap , levator scap  PAIVM limitation in mid thoracic throughout for cPA   CERVICAL ROM:   ROM AROM (deg) 05/03/2022 AROM 05/13/2022 AROM 05/24/2022 AROM 06/30/2022 AROM 07/14/2022  Flexion 65 c pain noted 62 65 62 c pain 65  Extension 89 85  70 80  Right lateral flexion       Left lateral flexion       Right rotation 85   85 86  Left rotation 85   90 86   (Blank rows = not tested)  UPPER EXTREMITY ROM:  ROM Right 05/03/2022 Left 05/03/2022  Shoulder flexion    Shoulder extension    Shoulder abduction    Shoulder adduction    Shoulder extension    Shoulder internal rotation    Shoulder external rotation    Elbow flexion    Elbow extension    Wrist flexion    Wrist extension    Wrist ulnar deviation    Wrist radial deviation    Wrist pronation    Wrist supination     (Blank rows = not tested)  UPPER EXTREMITY MMT:  MMT Right 05/03/2022 Left 05/03/2022 Right 06/30/2022 Left 06/30/2022  Shoulder flexion 5/5 5/5 5/5 5/5  Shoulder extension      Shoulder abduction 5/5 5/5 5/5 5/5  Shoulder adduction      Shoulder extension      Shoulder internal rotation 5/5 5/5 5/5 5/5  Shoulder external rotation 4+/5 4+/5 5/5 5/5  Middle trapezius      Lower trapezius      Elbow flexion      Elbow extension      Wrist flexion      Wrist extension      Wrist ulnar deviation      Wrist radial deviation      Wrist pronation      Wrist supination      Grip strength       (Blank rows = not tested)  CERVICAL SPECIAL TESTS:  05/03/2022 No specific testing today   FUNCTIONAL TESTS:  05/03/2022 No specific testing today   TODAY'S TREATMENT: DATE:  08/11/2022 Manual:  Compression to bilateral upper trap  Trigger Point Dry-Needling: Treatment instructions: Expect mild to moderate  muscle soreness. S/S of pneumothorax if dry needled over a lung field, and to seek immediate medical attention should they occur. Patient verbalized understanding of these  instructions and education.  Patient Consent Given: Yes Education handout provided: Yes Muscles treated: Rt , Lt upper trap  Treatment response/outcome: Concordant symptoms, fair tolerance, no adverse reactions observed other than typical soreness.    Therex: Standing green band shoulder ER to neutral with flexion punch  10 bilateral c cues for home.  Advice given on possible percussive device purchase for home for self care.    DATE:  07/28/2022 Manual:  Compression to bilateral upper trap  Trigger Point Dry-Needling: Treatment instructions: Expect mild to moderate muscle soreness. S/S of pneumothorax if dry needled over a lung field, and to seek immediate medical attention should they occur. Patient verbalized understanding of these instructions and education.  Patient Consent Given: Yes Education handout provided: Yes Muscles treated: Rt upper trap  Treatment response/outcome: Concordant symptoms, fair tolerance, no adverse reactions observed other than typical soreness.    Therex: Education on timing of HEP and cues for management of nighttime symptoms c use at nighttime more. Handout with new exercises listed below Prone scapular retraction c GH ext bilateral 5 sec hold x 10 Prone scapular retraction c horizontal abduction bilateral 5 sec hold x 10 Prone y hold bilateral 5 sec x 10   Estim Pre mod to tolerance 10 mins to bilateral upper trap c self stretching throughout.  No adverse reactions during.  Moist heat Rt upper trap during   DATE:  07/14/2022 Manual:  Compression to bilateral upper trap  Trigger Point Dry-Needling: Treatment instructions: Expect mild to moderate muscle soreness. S/S of pneumothorax if dry needled over a lung field, and to seek immediate medical attention should they occur. Patient verbalized understanding of these instructions and education.  Patient Consent Given: Yes Education handout provided: Yes Muscles treated: Rt upper trap, Lt upper  trap  Treatment response/outcome: Concordant symptoms, fair tolerance, no adverse reactions observed other than typical soreness.   Estim Pre mod to tolerance 10 mins to bilateral upper trap c self stretching throughout.  No adverse reactions during.  Moist heat Rt upper trap during   DATE:  07/05/2022 Manual:  Compression to bilateral upper trap  Trigger Point Dry-Needling: Treatment instructions: Expect mild to moderate muscle soreness. S/S of pneumothorax if dry needled over a lung field, and to seek immediate medical attention should they occur. Patient verbalized understanding of these instructions and education.  Patient Consent Given: Yes Education handout provided: Yes Muscles treated: Rt upper trap, Lt upper trap, Rt mid cervical paraspinals   Treatment response/outcome: Concordant symptoms, fair tolerance, no adverse reactions observed other than typical soreness.   Estim Pre mod to tolerance 10 mins to bilateral upper trap c self stretching throughout.  No adverse reactions during.     PATIENT EDUCATION:  07/28/2022 Education details: HEP update Person educated: Patient Education method: Programmer, multimedia, Demonstration, Verbal cues, and Handouts Education comprehension: verbalized understanding, returned demonstration, and verbal cues required  HOME EXERCISE PROGRAM: Access Code: [email or web address] URL: [email or web address] Date: 07/28/2022 Prepared by: Chyrel Masson  Exercises - Seated Upper Trapezius Stretch  - 3-5 x daily - 7 x weekly - 1 sets - 3 reps - 15 hold - Gentle Levator Scapulae Stretch  - 3-5 x daily - 7 x weekly - 1 sets - 3 reps - 15 hold - Shoulder External Rotation and Scapular Retraction with Resistance  - 1-2 x daily - 7 x  weekly - 1-2 sets - 10-15 reps - Seated Cervical Retraction  - 1 x daily - 7 x weekly - 3 sets - 10 reps - Standing Shoulder Row with Anchored Resistance  - 1-2 x daily - 7 x weekly - 1-2 sets - 10-15 reps - Shoulder Extension  with Resistance  - 1-2 x daily - 7 x weekly - 1-2 sets - 10-15 reps - Standing Shoulder Horizontal Abduction with Resistance  - 1-2 x daily - 7 x weekly - 1-2 sets - 10-15 reps - Prone Scapular Slide with Shoulder Extension  - 1 x daily - 7 x weekly - 1 sets - 10 reps - 5 hold - Prone Scapular Retraction Arms at Side  - 1 x daily - 7 x weekly - 1 sets - 10 reps - 5 hold - Prone Scapular Retraction Y  - 1 x daily - 7 x weekly - 1 sets - 10 reps - 5 hold  ASSESSMENT:  CLINICAL IMPRESSION Improvement in symptoms reported in last 2 weeks.  Positive benefit was indicated from percussive device usage.  Cues and resources of where to purchase if desired.  Plan to continue skilled PT services as necessary to help improve symptoms and functional lifting of child.   OBJECTIVE IMPAIRMENTS: decreased endurance, decreased mobility, decreased ROM, decreased strength, increased fascial restrictions, impaired perceived functional ability, increased muscle spasms, impaired flexibility, impaired UE functional use, improper body mechanics, postural dysfunction, and pain.   ACTIVITY LIMITATIONS: carrying, lifting, bending, sitting, and sleeping  PARTICIPATION LIMITATIONS: interpersonal relationship, community activity, and occupation  PERSONAL FACTORS: No specific factors are also affecting patient's functional outcome.   REHAB POTENTIAL: Good  CLINICAL DECISION MAKING: Stable/uncomplicated  EVALUATION COMPLEXITY: Low   GOALS: Goals reviewed with patient? Yes  SHORT TERM GOALS: (target date for Short term goals are 3 weeks 05/24/2022)  1.Patient will demonstrate independent use of home exercise program to maintain progress from in clinic treatments. Goal status: Met 05/13/2022  LONG TERM GOALS: (target dates for all long term goals are 8 weeks  09/08/2022 )   1. Patient will demonstrate/report pain at worst less than or equal to 2/10 to facilitate minimal limitation in daily activity secondary to pain  symptoms. Goal status: on going 08/11/2022   2. Patient will demonstrate independent use of home exercise program to facilitate ability to maintain/progress functional gains from skilled physical therapy services. Goal status: on going 08/11/2022   3. Patient will demonstrate FOTO outcome > or = 68 % to indicate reduced disability due to condition. Goal status: on going 08/11/2022   4.  Patient will demonstrate cervical AROM WFL s symptoms to facilitate usual head movements for daily activity including driving, self care.   Goal status: on going 08/11/2022   5.  Patient will demonstrate bilateral shoulder MMT 5/5 throughout to facilitate lifting, carrying at PLOF.   Goal status: on going 08/11/2022   6.  Patient will demonstrate/report ability to sleep s restriction due to symptoms.  Goal status: on going 08/11/2022    PLAN:  PT FREQUENCY: 1-2x/week  PT DURATION: 8 weeks  PLANNED INTERVENTIONS: Therapeutic exercises, Therapeutic activity, Neuro Muscular re-education, Balance training, Gait training, Patient/Family education, Joint mobilization, Stair training, DME instructions, Dry Needling, Electrical stimulation, Cryotherapy, vasopneumatic device,Traction, Moist heat, Taping, Ultrasound, Ionotophoresis 4mg /ml Dexamethasone, and Manual therapy.  All included unless contraindicated  PLAN FOR NEXT SESSION:   Follow up on ER c flexion punch activity.  Progressive strengthening for UE for lifting capacity.  Needling/manual prn.  Chyrel Masson, PT, DPT, OCS, ATC 08/11/22  12:14 PM  PHYSICAL THERAPY DISCHARGE SUMMARY  Visits from Start of Care: 10  Current functional level related to goals / functional outcomes: See note   Remaining deficits: See note   Education / Equipment: HEP  Patient goals were partially met. Patient is being discharged due to not returning since the last visit.  Chyrel Masson, PT, DPT, OCS, ATC 09/16/22  10:24 AM

## 2022-08-25 DIAGNOSIS — H04123 Dry eye syndrome of bilateral lacrimal glands: Secondary | ICD-10-CM | POA: Diagnosis not present

## 2022-08-25 DIAGNOSIS — H10413 Chronic giant papillary conjunctivitis, bilateral: Secondary | ICD-10-CM | POA: Diagnosis not present

## 2022-08-25 DIAGNOSIS — H5213 Myopia, bilateral: Secondary | ICD-10-CM | POA: Diagnosis not present

## 2022-08-26 ENCOUNTER — Other Ambulatory Visit: Payer: BC Managed Care – PPO

## 2022-09-06 DIAGNOSIS — H9202 Otalgia, left ear: Secondary | ICD-10-CM | POA: Diagnosis not present

## 2022-09-06 DIAGNOSIS — H9212 Otorrhea, left ear: Secondary | ICD-10-CM | POA: Diagnosis not present

## 2022-09-06 DIAGNOSIS — H6692 Otitis media, unspecified, left ear: Secondary | ICD-10-CM | POA: Diagnosis not present

## 2022-09-06 DIAGNOSIS — H7292 Unspecified perforation of tympanic membrane, left ear: Secondary | ICD-10-CM | POA: Diagnosis not present

## 2022-09-18 ENCOUNTER — Other Ambulatory Visit: Payer: Self-pay | Admitting: Internal Medicine

## 2022-09-18 DIAGNOSIS — K523 Indeterminate colitis: Secondary | ICD-10-CM

## 2022-09-23 ENCOUNTER — Other Ambulatory Visit: Payer: BC Managed Care – PPO

## 2022-11-04 ENCOUNTER — Ambulatory Visit
Admission: RE | Admit: 2022-11-04 | Discharge: 2022-11-04 | Disposition: A | Discharge status: Home / Self Care | Payer: BC Managed Care – PPO | Source: Ambulatory Visit | Attending: Obstetrics and Gynecology | Admitting: Obstetrics and Gynecology

## 2022-11-04 DIAGNOSIS — Z803 Family history of malignant neoplasm of breast: Secondary | ICD-10-CM | POA: Diagnosis not present

## 2022-11-04 DIAGNOSIS — N644 Mastodynia: Secondary | ICD-10-CM | POA: Diagnosis not present

## 2022-11-04 MED ORDER — GADOPICLENOL 0.5 MMOL/ML IV SOLN
7.0000 mL | Freq: Once | INTRAVENOUS | Status: AC | PRN
  Administered 2022-11-04: 7 mL via INTRAVENOUS

## 2022-11-29 DIAGNOSIS — H7292 Unspecified perforation of tympanic membrane, left ear: Secondary | ICD-10-CM | POA: Diagnosis not present

## 2022-11-29 DIAGNOSIS — H9012 Conductive hearing loss, unilateral, left ear, with unrestricted hearing on the contralateral side: Secondary | ICD-10-CM | POA: Diagnosis not present

## 2022-11-29 DIAGNOSIS — J309 Allergic rhinitis, unspecified: Secondary | ICD-10-CM | POA: Diagnosis not present

## 2023-02-04 DIAGNOSIS — H7292 Unspecified perforation of tympanic membrane, left ear: Secondary | ICD-10-CM | POA: Diagnosis not present

## 2023-02-04 DIAGNOSIS — H9012 Conductive hearing loss, unilateral, left ear, with unrestricted hearing on the contralateral side: Secondary | ICD-10-CM | POA: Diagnosis not present

## 2023-03-29 DIAGNOSIS — R7989 Other specified abnormal findings of blood chemistry: Secondary | ICD-10-CM | POA: Diagnosis not present

## 2023-03-29 DIAGNOSIS — Z79899 Other long term (current) drug therapy: Secondary | ICD-10-CM | POA: Diagnosis not present

## 2023-03-29 DIAGNOSIS — D649 Anemia, unspecified: Secondary | ICD-10-CM | POA: Diagnosis not present

## 2023-03-29 DIAGNOSIS — E785 Hyperlipidemia, unspecified: Secondary | ICD-10-CM | POA: Diagnosis not present

## 2023-04-05 DIAGNOSIS — Z0000 Encounter for general adult medical examination without abnormal findings: Secondary | ICD-10-CM | POA: Diagnosis not present

## 2023-04-05 DIAGNOSIS — E785 Hyperlipidemia, unspecified: Secondary | ICD-10-CM | POA: Diagnosis not present

## 2023-04-05 DIAGNOSIS — Z23 Encounter for immunization: Secondary | ICD-10-CM | POA: Diagnosis not present

## 2023-04-05 DIAGNOSIS — R82998 Other abnormal findings in urine: Secondary | ICD-10-CM | POA: Diagnosis not present

## 2023-04-05 DIAGNOSIS — Z1331 Encounter for screening for depression: Secondary | ICD-10-CM | POA: Diagnosis not present

## 2023-05-10 DIAGNOSIS — H9012 Conductive hearing loss, unilateral, left ear, with unrestricted hearing on the contralateral side: Secondary | ICD-10-CM | POA: Diagnosis not present

## 2023-05-10 DIAGNOSIS — H7292 Unspecified perforation of tympanic membrane, left ear: Secondary | ICD-10-CM | POA: Diagnosis not present

## 2023-06-02 DIAGNOSIS — H7202 Central perforation of tympanic membrane, left ear: Secondary | ICD-10-CM | POA: Diagnosis not present

## 2023-06-02 DIAGNOSIS — H9012 Conductive hearing loss, unilateral, left ear, with unrestricted hearing on the contralateral side: Secondary | ICD-10-CM | POA: Diagnosis not present

## 2023-06-02 DIAGNOSIS — H6692 Otitis media, unspecified, left ear: Secondary | ICD-10-CM | POA: Diagnosis not present

## 2023-09-06 DIAGNOSIS — H5213 Myopia, bilateral: Secondary | ICD-10-CM | POA: Diagnosis not present

## 2023-10-04 DIAGNOSIS — Z85820 Personal history of malignant melanoma of skin: Secondary | ICD-10-CM | POA: Diagnosis not present

## 2023-10-04 DIAGNOSIS — D225 Melanocytic nevi of trunk: Secondary | ICD-10-CM | POA: Diagnosis not present

## 2023-10-04 DIAGNOSIS — D2230 Melanocytic nevi of unspecified part of face: Secondary | ICD-10-CM | POA: Diagnosis not present

## 2023-10-04 DIAGNOSIS — L821 Other seborrheic keratosis: Secondary | ICD-10-CM | POA: Diagnosis not present

## 2023-10-12 DIAGNOSIS — Z6827 Body mass index (BMI) 27.0-27.9, adult: Secondary | ICD-10-CM | POA: Diagnosis not present

## 2023-10-12 DIAGNOSIS — Z01419 Encounter for gynecological examination (general) (routine) without abnormal findings: Secondary | ICD-10-CM | POA: Diagnosis not present

## 2023-10-14 DIAGNOSIS — D509 Iron deficiency anemia, unspecified: Secondary | ICD-10-CM | POA: Diagnosis not present

## 2023-10-14 DIAGNOSIS — R030 Elevated blood-pressure reading, without diagnosis of hypertension: Secondary | ICD-10-CM | POA: Diagnosis not present

## 2023-10-14 DIAGNOSIS — Z1389 Encounter for screening for other disorder: Secondary | ICD-10-CM | POA: Diagnosis not present

## 2023-10-14 DIAGNOSIS — R5383 Other fatigue: Secondary | ICD-10-CM | POA: Diagnosis not present

## 2023-10-17 DIAGNOSIS — R030 Elevated blood-pressure reading, without diagnosis of hypertension: Secondary | ICD-10-CM | POA: Diagnosis not present

## 2023-11-01 DIAGNOSIS — H7292 Unspecified perforation of tympanic membrane, left ear: Secondary | ICD-10-CM | POA: Diagnosis not present

## 2023-11-01 DIAGNOSIS — H90A32 Mixed conductive and sensorineural hearing loss, unilateral, left ear with restricted hearing on the contralateral side: Secondary | ICD-10-CM | POA: Diagnosis not present

## 2023-12-01 DIAGNOSIS — R030 Elevated blood-pressure reading, without diagnosis of hypertension: Secondary | ICD-10-CM | POA: Diagnosis not present

## 2023-12-12 DIAGNOSIS — R030 Elevated blood-pressure reading, without diagnosis of hypertension: Secondary | ICD-10-CM | POA: Diagnosis not present

## 2024-04-06 DIAGNOSIS — Z1212 Encounter for screening for malignant neoplasm of rectum: Secondary | ICD-10-CM | POA: Diagnosis not present

## 2024-05-11 ENCOUNTER — Encounter: Payer: Self-pay | Admitting: Internal Medicine

## 2024-06-08 ENCOUNTER — Encounter

## 2024-06-22 ENCOUNTER — Encounter: Admitting: Internal Medicine
